# Patient Record
Sex: Female | Born: 1965
Health system: Southern US, Community
[De-identification: ages and names within clinical notes are randomized; demographics above are authoritative.]

## PROBLEM LIST (undated history)

## (undated) DIAGNOSIS — Z9109 Other allergy status, other than to drugs and biological substances: Secondary | ICD-10-CM

## (undated) DIAGNOSIS — D259 Leiomyoma of uterus, unspecified: Secondary | ICD-10-CM

## (undated) DIAGNOSIS — F329 Major depressive disorder, single episode, unspecified: Secondary | ICD-10-CM

## (undated) DIAGNOSIS — F32A Depression, unspecified: Secondary | ICD-10-CM

## (undated) DIAGNOSIS — M1990 Unspecified osteoarthritis, unspecified site: Secondary | ICD-10-CM

## (undated) DIAGNOSIS — D571 Sickle-cell disease without crisis: Secondary | ICD-10-CM

## (undated) DIAGNOSIS — I10 Essential (primary) hypertension: Secondary | ICD-10-CM

## (undated) DIAGNOSIS — D573 Sickle-cell trait: Secondary | ICD-10-CM

## (undated) DIAGNOSIS — T7840XA Allergy, unspecified, initial encounter: Secondary | ICD-10-CM

## (undated) DIAGNOSIS — K219 Gastro-esophageal reflux disease without esophagitis: Secondary | ICD-10-CM

## (undated) DIAGNOSIS — D649 Anemia, unspecified: Secondary | ICD-10-CM

## (undated) DIAGNOSIS — R109 Unspecified abdominal pain: Secondary | ICD-10-CM

## (undated) HISTORY — PX: COLONOSCOPY: SHX174

## (undated) HISTORY — DX: Other allergy status, other than to drugs and biological substances: Z91.09

## (undated) HISTORY — DX: Unspecified osteoarthritis, unspecified site: M19.90

## (undated) HISTORY — PX: TONSILLECTOMY: SHX5217

## (undated) HISTORY — DX: Allergy, unspecified, initial encounter: T78.40XA

## (undated) HISTORY — DX: Sickle-cell trait: D57.3

## (undated) HISTORY — DX: Gastro-esophageal reflux disease without esophagitis: K21.9

## (undated) HISTORY — DX: Major depressive disorder, single episode, unspecified: F32.9

## (undated) HISTORY — DX: Leiomyoma of uterus, unspecified: D25.9

## (undated) HISTORY — PX: TUBAL LIGATION: SHX77

## (undated) HISTORY — DX: Depression, unspecified: F32.A

## (undated) HISTORY — DX: Sickle-cell disease without crisis: D57.1

## (undated) HISTORY — DX: Essential (primary) hypertension: I10

## (undated) HISTORY — DX: Unspecified abdominal pain: R10.9

---

## 1997-08-12 ENCOUNTER — Other Ambulatory Visit: Admission: RE | Admit: 1997-08-12 | Discharge: 1997-08-12 | Payer: Self-pay | Admitting: *Deleted

## 1998-08-26 ENCOUNTER — Other Ambulatory Visit: Admission: RE | Admit: 1998-08-26 | Discharge: 1998-08-26 | Payer: Self-pay | Admitting: *Deleted

## 1999-12-08 ENCOUNTER — Other Ambulatory Visit: Admission: RE | Admit: 1999-12-08 | Discharge: 1999-12-08 | Payer: Self-pay | Admitting: *Deleted

## 2000-12-21 ENCOUNTER — Other Ambulatory Visit: Admission: RE | Admit: 2000-12-21 | Discharge: 2000-12-21 | Payer: Self-pay | Admitting: *Deleted

## 2002-10-09 ENCOUNTER — Other Ambulatory Visit: Admission: RE | Admit: 2002-10-09 | Discharge: 2002-10-09 | Payer: Self-pay | Admitting: Obstetrics and Gynecology

## 2003-04-10 LAB — HM COLONOSCOPY: HM Colonoscopy: NORMAL

## 2006-09-12 ENCOUNTER — Ambulatory Visit: Payer: Self-pay | Admitting: Internal Medicine

## 2007-06-22 ENCOUNTER — Encounter: Payer: Self-pay | Admitting: *Deleted

## 2007-06-22 DIAGNOSIS — Z9089 Acquired absence of other organs: Secondary | ICD-10-CM | POA: Insufficient documentation

## 2007-06-22 DIAGNOSIS — Z9109 Other allergy status, other than to drugs and biological substances: Secondary | ICD-10-CM

## 2007-06-22 DIAGNOSIS — J020 Streptococcal pharyngitis: Secondary | ICD-10-CM | POA: Insufficient documentation

## 2007-06-22 DIAGNOSIS — I10 Essential (primary) hypertension: Secondary | ICD-10-CM | POA: Insufficient documentation

## 2007-06-22 HISTORY — DX: Other allergy status, other than to drugs and biological substances: Z91.09

## 2007-06-22 HISTORY — DX: Essential (primary) hypertension: I10

## 2007-08-21 ENCOUNTER — Inpatient Hospital Stay (HOSPITAL_COMMUNITY): Admission: AD | Admit: 2007-08-21 | Discharge: 2007-08-21 | Payer: Self-pay | Admitting: Obstetrics and Gynecology

## 2007-08-30 ENCOUNTER — Ambulatory Visit: Payer: Self-pay | Admitting: Obstetrics & Gynecology

## 2007-09-07 ENCOUNTER — Ambulatory Visit: Payer: Self-pay | Admitting: Family Medicine

## 2007-09-12 ENCOUNTER — Ambulatory Visit (HOSPITAL_COMMUNITY): Admission: RE | Admit: 2007-09-12 | Discharge: 2007-09-12 | Payer: Self-pay | Admitting: Family Medicine

## 2007-09-14 ENCOUNTER — Ambulatory Visit (HOSPITAL_COMMUNITY): Admission: RE | Admit: 2007-09-14 | Discharge: 2007-09-14 | Payer: Self-pay

## 2007-09-18 ENCOUNTER — Inpatient Hospital Stay (HOSPITAL_COMMUNITY): Admission: AD | Admit: 2007-09-18 | Discharge: 2007-09-18 | Payer: Self-pay | Admitting: Obstetrics & Gynecology

## 2007-09-21 ENCOUNTER — Ambulatory Visit: Payer: Self-pay | Admitting: Obstetrics & Gynecology

## 2007-09-25 ENCOUNTER — Ambulatory Visit (HOSPITAL_COMMUNITY): Admission: RE | Admit: 2007-09-25 | Discharge: 2007-09-25 | Payer: Self-pay | Admitting: Family Medicine

## 2007-09-25 ENCOUNTER — Ambulatory Visit: Payer: Self-pay | Admitting: Family Medicine

## 2007-10-05 ENCOUNTER — Ambulatory Visit: Payer: Self-pay | Admitting: Obstetrics & Gynecology

## 2007-10-10 ENCOUNTER — Ambulatory Visit (HOSPITAL_COMMUNITY): Admission: RE | Admit: 2007-10-10 | Discharge: 2007-10-10 | Payer: Self-pay | Admitting: Family Medicine

## 2007-10-19 ENCOUNTER — Ambulatory Visit: Payer: Self-pay | Admitting: Family Medicine

## 2007-10-24 ENCOUNTER — Ambulatory Visit (HOSPITAL_COMMUNITY): Admission: RE | Admit: 2007-10-24 | Discharge: 2007-10-24 | Payer: Self-pay | Admitting: Gynecology

## 2007-11-02 ENCOUNTER — Ambulatory Visit: Payer: Self-pay | Admitting: Obstetrics & Gynecology

## 2007-11-09 ENCOUNTER — Ambulatory Visit (HOSPITAL_COMMUNITY): Admission: RE | Admit: 2007-11-09 | Discharge: 2007-11-09 | Payer: Self-pay | Admitting: Gynecology

## 2007-11-16 ENCOUNTER — Ambulatory Visit: Payer: Self-pay | Admitting: Family Medicine

## 2007-11-30 ENCOUNTER — Ambulatory Visit (HOSPITAL_COMMUNITY): Admission: RE | Admit: 2007-11-30 | Discharge: 2007-11-30 | Payer: Self-pay | Admitting: Gynecology

## 2007-11-30 ENCOUNTER — Ambulatory Visit: Payer: Self-pay | Admitting: Family Medicine

## 2007-12-14 ENCOUNTER — Ambulatory Visit: Payer: Self-pay | Admitting: Obstetrics & Gynecology

## 2007-12-21 ENCOUNTER — Ambulatory Visit (HOSPITAL_COMMUNITY): Admission: RE | Admit: 2007-12-21 | Discharge: 2007-12-21 | Payer: Self-pay | Admitting: Gynecology

## 2007-12-28 ENCOUNTER — Ambulatory Visit: Payer: Self-pay | Admitting: Family Medicine

## 2007-12-28 ENCOUNTER — Ambulatory Visit (HOSPITAL_COMMUNITY): Admission: RE | Admit: 2007-12-28 | Discharge: 2007-12-28 | Payer: Self-pay | Admitting: Gynecology

## 2008-01-02 ENCOUNTER — Encounter: Payer: Self-pay | Admitting: *Deleted

## 2008-01-02 ENCOUNTER — Inpatient Hospital Stay (HOSPITAL_COMMUNITY): Admission: AD | Admit: 2008-01-02 | Discharge: 2008-01-04 | Payer: Self-pay | Admitting: Obstetrics & Gynecology

## 2008-01-02 ENCOUNTER — Ambulatory Visit: Payer: Self-pay | Admitting: Family Medicine

## 2008-01-05 ENCOUNTER — Inpatient Hospital Stay (HOSPITAL_COMMUNITY): Admission: AD | Admit: 2008-01-05 | Discharge: 2008-01-05 | Payer: Self-pay | Admitting: Obstetrics & Gynecology

## 2008-01-05 ENCOUNTER — Ambulatory Visit: Payer: Self-pay | Admitting: Obstetrics & Gynecology

## 2008-01-05 ENCOUNTER — Ambulatory Visit: Payer: Self-pay | Admitting: Obstetrics and Gynecology

## 2008-01-08 ENCOUNTER — Ambulatory Visit: Payer: Self-pay | Admitting: Obstetrics & Gynecology

## 2008-01-08 ENCOUNTER — Ambulatory Visit: Payer: Self-pay | Admitting: Family Medicine

## 2008-01-08 ENCOUNTER — Inpatient Hospital Stay (HOSPITAL_COMMUNITY): Admission: AD | Admit: 2008-01-08 | Discharge: 2008-01-09 | Payer: Self-pay | Admitting: Gynecology

## 2008-01-08 ENCOUNTER — Encounter: Payer: Self-pay | Admitting: *Deleted

## 2008-01-16 ENCOUNTER — Ambulatory Visit: Payer: Self-pay | Admitting: Family Medicine

## 2008-01-23 ENCOUNTER — Ambulatory Visit: Payer: Self-pay | Admitting: Internal Medicine

## 2008-02-01 ENCOUNTER — Ambulatory Visit: Payer: Self-pay | Admitting: Obstetrics and Gynecology

## 2008-02-01 ENCOUNTER — Encounter: Payer: Self-pay | Admitting: Obstetrics and Gynecology

## 2008-02-27 ENCOUNTER — Ambulatory Visit (HOSPITAL_COMMUNITY): Admission: RE | Admit: 2008-02-27 | Discharge: 2008-02-27 | Payer: Self-pay | Admitting: Obstetrics and Gynecology

## 2008-03-11 ENCOUNTER — Ambulatory Visit (HOSPITAL_COMMUNITY): Admission: RE | Admit: 2008-03-11 | Discharge: 2008-03-11 | Payer: Self-pay | Admitting: Obstetrics & Gynecology

## 2008-03-11 ENCOUNTER — Ambulatory Visit: Payer: Self-pay | Admitting: Obstetrics & Gynecology

## 2008-03-20 ENCOUNTER — Ambulatory Visit: Payer: Self-pay | Admitting: Obstetrics & Gynecology

## 2008-04-29 ENCOUNTER — Ambulatory Visit (HOSPITAL_COMMUNITY): Admission: RE | Admit: 2008-04-29 | Discharge: 2008-04-29 | Payer: Self-pay | Admitting: Obstetrics and Gynecology

## 2008-05-08 ENCOUNTER — Ambulatory Visit: Payer: Self-pay | Admitting: Obstetrics & Gynecology

## 2008-07-22 ENCOUNTER — Telehealth: Payer: Self-pay | Admitting: Internal Medicine

## 2008-07-23 ENCOUNTER — Encounter: Payer: Self-pay | Admitting: Internal Medicine

## 2008-07-29 ENCOUNTER — Encounter: Payer: Self-pay | Admitting: Internal Medicine

## 2009-03-25 ENCOUNTER — Ambulatory Visit (HOSPITAL_COMMUNITY): Admission: RE | Admit: 2009-03-25 | Discharge: 2009-03-25 | Payer: Self-pay | Admitting: Family Medicine

## 2009-04-02 ENCOUNTER — Encounter: Admission: RE | Admit: 2009-04-02 | Discharge: 2009-04-02 | Payer: Self-pay | Admitting: Family Medicine

## 2009-04-02 LAB — HM MAMMOGRAPHY

## 2009-04-15 ENCOUNTER — Telehealth: Payer: Self-pay | Admitting: Internal Medicine

## 2009-04-15 ENCOUNTER — Ambulatory Visit: Payer: Self-pay | Admitting: Internal Medicine

## 2009-04-15 DIAGNOSIS — R109 Unspecified abdominal pain: Secondary | ICD-10-CM | POA: Insufficient documentation

## 2009-04-15 HISTORY — DX: Unspecified abdominal pain: R10.9

## 2009-04-15 LAB — CONVERTED CEMR LAB
ALT: 14 units/L (ref 0–35)
AST: 17 units/L (ref 0–37)
Albumin: 3.7 g/dL (ref 3.5–5.2)
Alkaline Phosphatase: 47 units/L (ref 39–117)
BUN: 10 mg/dL (ref 6–23)
Basophils Absolute: 0 10*3/uL (ref 0.0–0.1)
Basophils Relative: 0.8 % (ref 0.0–3.0)
Bilirubin Urine: NEGATIVE
Bilirubin, Direct: 0.2 mg/dL (ref 0.0–0.3)
Blood in Urine, dipstick: NEGATIVE
CO2: 30 meq/L (ref 19–32)
Calcium: 9.2 mg/dL (ref 8.4–10.5)
Chloride: 104 meq/L (ref 96–112)
Creatinine, Ser: 0.9 mg/dL (ref 0.4–1.2)
Eosinophils Absolute: 0.1 10*3/uL (ref 0.0–0.7)
Eosinophils Relative: 1.9 % (ref 0.0–5.0)
GFR calc non Af Amer: 86.9 mL/min (ref >60)
Glucose, Bld: 96 mg/dL (ref 70–99)
Glucose, Urine, Semiquant: NEGATIVE
HCT: 38.3 % (ref 36.0–46.0)
Hemoglobin: 12.8 g/dL (ref 12.0–15.0)
Ketones, urine, test strip: NEGATIVE
Lymphocytes Relative: 36.8 % (ref 12.0–46.0)
Lymphs Abs: 2.3 10*3/uL (ref 0.7–4.0)
MCHC: 33.3 g/dL (ref 30.0–36.0)
MCV: 87.5 fL (ref 78.0–100.0)
Monocytes Absolute: 0.7 10*3/uL (ref 0.1–1.0)
Monocytes Relative: 10.8 % (ref 3.0–12.0)
Neutro Abs: 3.1 10*3/uL (ref 1.4–7.7)
Neutrophils Relative %: 49.7 % (ref 43.0–77.0)
Nitrite: NEGATIVE
Platelets: 173 10*3/uL (ref 150.0–400.0)
Potassium: 3.7 meq/L (ref 3.5–5.1)
Protein, U semiquant: 30
RBC: 4.38 M/uL (ref 3.87–5.11)
RDW: 14.3 % (ref 11.5–14.6)
Sodium: 141 meq/L (ref 135–145)
Specific Gravity, Urine: 1.025
Total Bilirubin: 1.1 mg/dL (ref 0.3–1.2)
Total Protein: 7 g/dL (ref 6.0–8.3)
Urobilinogen, UA: 0.2
WBC Urine, dipstick: NEGATIVE
WBC: 6.2 10*3/uL (ref 4.5–10.5)
pH: 5

## 2009-04-16 ENCOUNTER — Telehealth: Payer: Self-pay | Admitting: Internal Medicine

## 2009-04-16 ENCOUNTER — Ambulatory Visit: Payer: Self-pay | Admitting: Internal Medicine

## 2009-05-30 ENCOUNTER — Ambulatory Visit: Payer: Self-pay | Admitting: Obstetrics & Gynecology

## 2009-05-30 LAB — CONVERTED CEMR LAB: Pap Smear: NEGATIVE

## 2009-05-30 LAB — HM PAP SMEAR: HM Pap smear: NORMAL

## 2010-05-28 NOTE — Progress Notes (Signed)
  Phone Note Call from Patient   Caller: Patient Summary of Call: Dr. Debby Bud received email from pt requesting to restart HCTZ. Dr. Debby Bud approved same. Initial call taken by: Zackery Barefoot CMA,  July 22, 2008 12:40 PM    New/Updated Medications: HYDROCHLOROTHIAZIDE 12.5 MG TABS (HYDROCHLOROTHIAZIDE) Take 1 tablet by mouth once a day   Prescriptions: HYDROCHLOROTHIAZIDE 12.5 MG TABS (HYDROCHLOROTHIAZIDE) Take 1 tablet by mouth once a day  #30 x 5   Entered by:   Zackery Barefoot CMA   Authorized by:   Jacques Navy MD   Signed by:   Zackery Barefoot CMA on 07/22/2008   Method used:   Electronically to        [facility] [address] (retail)       [id number]       [place], [place]  [address]       Ph: [phone]       Fax: [phone]   RxID:   [id number]

## 2010-05-28 NOTE — Medication Information (Signed)
Summary: Request for HCTZ/Patient  Request for HCTZ/Patient   Imported By: Sherian Rein 07/23/2008 09:52:26  _____________________________________________________________________  External Attachment:    Type:   Image     Comment:   External Document
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
## 2010-05-28 NOTE — Letter (Signed)
Summary: Rx HCTZ request from pt/[facility]  Rx HCTZ request from pt/[facility]   Imported By: Lester [place] 08/07/2008 10:42:11  _____________________________________________________________________  External Attachment:    Type:   Image     Comment:   External Document

## 2010-05-28 NOTE — Progress Notes (Signed)
  Phone Note Call from Patient Call back at [facility] Phone [phone]   Summary of Call: Patient is requesting results of labs.  Initial call taken by: Lamar Sprinkles, CMA,  April 16, 2009 3:25 PM  Follow-up for Phone Call        normal Follow-up by: Etta Grandchild MD,  April 16, 2009 3:29 PM  Additional Follow-up for Phone Call Additional follow up Details #1::        Patient notified  labs/CT results normal per MD. [facility] wanted to ask MD what can she take for pain. Please advise Additional Follow-up by: Rock Nephew CMA,  April 16, 2009 4:10 PM    Additional Follow-up for Phone Call Additional follow up Details #2::    tramadol Follow-up by: Etta Grandchild MD,  April 16, 2009 4:12 PM  Additional Follow-up for Phone Call Additional follow up Details #3:: Details for Additional Follow-up Action Taken: pt notified Additional Follow-up by: Rock Nephew CMA,  April 16, 2009 4:17 PM  New/Updated Medications: TRAMADOL HCL 50 MG TABS (TRAMADOL HCL) 1-2 by mouth QID rpn for pain Prescriptions: TRAMADOL HCL 50 MG TABS (TRAMADOL HCL) 1-2 by mouth QID rpn for pain  #50 x 1   Entered and Authorized by:   Etta Grandchild MD   Signed by:   Etta Grandchild MD on 04/16/2009   Method used:   Electronically to        [facility] [address] (retail)       [id number]       [place], [place]  [address]       Ph: [phone]       Fax: [phone]   RxID:   [id number]
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
## 2010-07-29 ENCOUNTER — Other Ambulatory Visit: Payer: Self-pay | Admitting: Obstetrics & Gynecology

## 2010-07-29 DIAGNOSIS — Z1231 Encounter for screening mammogram for malignant neoplasm of breast: Secondary | ICD-10-CM

## 2010-08-10 ENCOUNTER — Ambulatory Visit (HOSPITAL_COMMUNITY)
Admission: RE | Admit: 2010-08-10 | Discharge: 2010-08-10 | Disposition: A | Discharge status: Home / Self Care | Payer: Self-pay | Source: Ambulatory Visit | Attending: Obstetrics & Gynecology | Admitting: Obstetrics & Gynecology

## 2010-08-10 DIAGNOSIS — Z1231 Encounter for screening mammogram for malignant neoplasm of breast: Secondary | ICD-10-CM

## 2010-09-08 NOTE — Discharge Summary (Signed)
NAME:  Aimee Anderson, WINBERRY NO.:  [id number]   MEDICAL RECORD NO.:  [id number]          PATIENT TYPE:  INP   LOCATION:  9157                          FACILITY:  [facility]   PHYSICIAN:  Lesly Dukes, M.D. DATE OF BIRTH:  [birth year]   DATE OF ADMISSION:  01/02/2008  DATE OF DISCHARGE:  01/04/2008                               DISCHARGE SUMMARY   REASON FOR HOSPITALIZATION:  Ms. Aimee Anderson is a 45 year old gravida  5, para 2-1-1-2 admitted at 26 weeks' gestational age for monitoring  after an amniocentesis for fetal chromosome check.  An ultrasound done  at that time revealed intrauterine growth restriction which was known.  However, the new finding was reverse flow on umbilical artery Dopplers.  Upon admission to the hospital, the patient with known chronic  hypertension was found to have severely elevated blood pressures in the  range of 160s over 100s to 110s.  Other important factors for history  her integrated screen revealed 1 in 5 risk for trisomy 22, she has  advanced maternal age, she has an incompetent cervix with cervical  cerclage that was placed at 13 weeks.  She had been taking labetalol 200  mg twice a day for her blood pressure during this pregnancy.   HOSPITAL COURSE:  The patient was admitted and her elevated blood  pressures required extra dosage of oral labetalol as well as IV  hydralazine for better blood pressure control.  Several hours after  admission, the results of the amniocentesis revealed confirm the  presence of trisomy 18.  Because of the fact that this is a non-  survivable congenital anomaly, the patient was significantly counseled  on the baby's survivability and the decision was made to expectantly  managed the patient.  She required an increase in her labetalol to 400  mg b.i.d. and an addition of Norvasc 5 mg daily for better blood  pressure control.  On the day of discharge, her blood pressures are in  the 130s over 70s to 80s range.   In the morning of discharge, she had a  headache, which prompted [place] to evaluate some labs.  Her urinalysis  revealed no protein.  Her CBC was normal.  A CMP had normal liver  functions as well as normal creatinine level.  Additionally, her  headache resolved after she ate breakfast and took a shower.  The  patient will be discharged home with a follow up tomorrow in the [facility] for blood pressure check and then another followup on Monday  January 08, 2008, for reevaluation.  The patient was given  prescriptions for her new blood pressure meds and advised on the new  dosing.  She was advised to restrict her activities to avoid any access  of walking or prolonged standing on her feet.  She was also advised to  return to the clinic or to return admission unit for any evidence of  contractions, loss of fluid, or fevers or chills.   PERTINENT LABORATORY DATA:  As described above.  A 24-hour urine  revealed a protein level of 90 mg.  FINAL DIAGNOSES:  1. Pregnancy-induced hypertension.  2. Fetal trisomy 18.  3. Incompetent cervix with cervical cerclage in place.   SIGNIFICANT FINDINGS:  Trisomy 18 per amniocentesis as described above.   PROCEDURE PERFORMED:  Amniocentesis.   CONDITION ON DISCHARGE:  Stable.   Instructions given to the patient as per hospital course.      Odie Sera, DO  Electronically Signed     ______________________________  Lesly Dukes, M.D.    MC/MEDQ  D:  01/04/2008  T:  01/05/2008  Job:  [id number]

## 2010-09-08 NOTE — Group Therapy Note (Signed)
NAME:  JAMESON, MORROW NO.:  [id number]   MEDICAL RECORD NO.:  [id number]         PATIENT TYPE:  AWOC   LOCATION:  [facility]                     FACILITY:   PHYSICIAN:  Johnella Moloney, MD        DATE OF BIRTH:  1966/03/03   DATE OF SERVICE:                                  CLINIC NOTE   REASON FOR VISIT:  Ms. Kyera Felan is a 45 year old gravida 5, para 2-  2-1-2, who is 4 weeks status post a spontaneous vaginal delivery after  an intrauterine fetal demise secondary to trisomy 25.  She presents  today for her routine postpartum followup.  She relates that her  bleeding has essentially ceased as of about 1 week ago.  She has minimal-  to-no abdominal pain.  She has been having no headaches or visual  problems.  Overall, she is doing quite well.  Emotionally, she is  hanging in there; however, she still has periods of sadness and crying.  She had recently decided that she is going to contact [employer], a  local organization, that deals with these kind of things.  Additionally,  it is important to note that she has been followed by a primary care  provider for her blood pressure regulation.   PHYSICAL EXAMINATION:  GENERAL:  Her blood pressure today is 131/91, her  heart rate is 74, respirations 16, temperature 97.1.  Her weight today  is 199.4 pounds.  LUNGS:  Clear to auscultation.  CARDIOVASCULAR:  Regular without murmur.  ABDOMEN:  Soft, obese, nontender.  Her uterus is barely palpable right  above her pubic symphysis.  BREASTS:  She has pendulous breasts with no discrete mass or nodule.  Fibrocystic breast tissue is noted.  She has no axillary  lymphadenopathy.  GU:  The external female genitalia is normal.  The vaginal mucosa is  normal.  Cervix is a parous cervix without lesion appreciated.  There is  a minimal amount of cervical mucus discharge.  Pap smear was done.  The  bimanual exam reveals a slightly enlarged uterus that is consistent with  4 weeks  postpartum state.  There is no adnexal mass or tenderness noted.  Her perineum appears normal.  I do not appreciate a cystocele or a  rectocele.   ASSESSMENT AND PLAN:  A routine postpartum exam.  No identifiable  abnormalities at this time.  A Pap smear is collected.  GC and Chlamydia  were also sent due to the patient's request for an IUD.  Because of her  lack of insurance, she completed the paperwork to get on the list for  funded mammogram as well as an IUD placement.  We had a lengthy  discussion on her contraceptive options and given her age as well as her  difficult to control blood pressure, I recommended to her that she  utilize a progesterone only type birth control and the patient would  prefer to have the IUD.  I discussed with her that if she cannot get the  IUD in time that she should probably come back for a Depo shot, so she  has some  type of contraception.  The patient will be notified by mail or  phone call of her Pap  results and she will return to the clinic when she gets notification of  her IUD finding.  The patient's questions were answered, and the patient  voiced understanding.     ______________________________  Odie Sera, D.O.    ______________________________  Johnella Moloney, MD    MC/MEDQ  D:  02/01/2008  T:  02/02/2008  Job:  [id number]
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
## 2010-09-08 NOTE — Op Note (Signed)
NAME:  Aimee Anderson, Aimee Anderson NO.:  [id number]   MEDICAL RECORD NO.:  [id number]          PATIENT TYPE:  AMB   LOCATION:                                [facility]   PHYSICIAN:  Tanya S. Shawnie Pons, M.D.   DATE OF BIRTH:  24-Jun-1965   DATE OF PROCEDURE:  09/25/2007  DATE OF DISCHARGE:                               OPERATIVE REPORT   PREOPERATIVE DIAGNOSIS:  Incompetent cervix.   POSTOPERATIVE DIAGNOSIS:  Incompetent cervix.   PROCEDURE:  Cervical cerclage, McDonald tie and Mersilene band knot at  12 o' clock.   SURGEON:  Shelbie Proctor. Shawnie Pons, MD   ASSISTANT:  None.   ANESTHESIA:  Spinal.   FINDINGS:  Cervix 1 cm at the external, very long cervix and parous.   SPECIMENS:  None.   ESTIMATED BLOOD LOSS:  Minimal.   COMPLICATIONS:  None immediately known.   HISTORY OF PRESENT ILLNESS:  The patient is a 45 year old who has a  history of incompetent cervix and has required two cervical cerclages in  the past.  She also has a history of previous C-section x1.  The patient  also has hypertension all about for this pregnancy. Of note, she has  undergone ovary screening which has revealed an increased risk of  trisomy 73, as well as increased risk of trisomy 86.  The patient has  been counseled regarding her options for this and she has declined  amniocentesis or further workup at this time. She does want a cerclage  even if these are possibilities.  The patient was counseled regarding  this on Thursday at [facility] and she reported god is going to  take care of this pregnancy.   PROCEDURE:  The patient was taken to the OR where she was placed in  dorsal lithotomy in Bolckow stirrups.  She was prepped and draped in the  usual sterile fashion.  A red rubber catheter was used to drain her  bladder.  A weighted speculum was placed inside the vagina and a  retractor was placed anteriorly and the cervix was visualized easily.  It was grasped with ring forceps and a  Mersilene band was used to place  a cerclage.  Cerclage was started at 12 o'clock to 9 o'clock from 9 to  6, from 6 to 3 and from 3 to 12.  The suture was cinched down until it  permitted  just a fingertip through the cervix and the knot tied a 12 o'clock.  All  edges were then removed from the vagina.  All instrument and lap counts  were correct x2.  The patient was awake and taken to the recovery room  in stable condition.      Shelbie Proctor. Shawnie Pons, M.D.  Electronically Signed     TSP/MEDQ  D:  09/25/2007  T:  09/25/2007  Job:  [id number]

## 2010-09-08 NOTE — Discharge Summary (Signed)
NAME:  Aimee Anderson, Aimee Anderson NO.:  [id number]   MEDICAL RECORD NO.:  [id number]          PATIENT TYPE:  INP   LOCATION:  9316                          [facility]ILITY:  [facility]   PHYSICIAN:  Lesly Dukes, M.D. DATE OF BIRTH:  [birth year]   DATE OF ADMISSION:  01/08/2008  DATE OF DISCHARGE:  01/09/2008                               DISCHARGE SUMMARY   REASON FOR HOSPITALIZATION:  [name] is a 45 year old gravida 5, para  2-1-1-2 at 39 and 5/7th weeks gestational age who was admitted for an  induction of labor after intrauterine fetal demise of fetus with known  trisomy 67.  On previous hospitalization approximately 1 week ago, the  patient was diagnosed with a fetus of trisomy 3 by amniocentesis.  The  patient was discharged from the hospital at that time per the patient's  request and returned on the date of admission for antenatal testing.  During that evaluation, the baby was noted to have no cardiac activity.  Therefore, the patient was admitted for induction of labor.   HOSPITAL COURSE:  The patient was admitted and her cerclage was removed.  A Foley bulb was placed for ripening of the cervix.  The patient was  started on Pitocin several hours later.  The patient progressed in the  usual manner and at 2:35 in a.m. on January 09, 2008, she had a  spontaneous vaginal delivery of a nonviable female in vertex  presentation.  She was given Cytotec 800 mcg per rectum to facilitate  delivery of the placenta.  Placenta delivered spontaneously  approximately 2 hours later.  Estimated blood loss for the delivery was  150 mL.  After the procedure, the patient did well.  Her blood pressure  was well-controlled with blood pressures in 120s-130s over 80s.  At the  time of admission, she has minimal lochia, pain is well tolerated, she  is ambulating, tolerating p.o.  She relates that she is emotionally  hanging in there.   FINAL DIAGNOSES:  1. Intrauterine fetal demise secondary  to trisomy 80.  2. Status post normal spontaneous vaginal delivery.  3. Chronic hypertension.   PROCEDURE PERFORMED:  Spontaneous vaginal delivery.   CONDITION OF PATIENT AT DISCHARGE.:  Good.   INSTRUCTIONS FOR THE PATIENT:  The patient was given no restrictions on  diet or activity.  The patient was instructed to take labetalol 400 mg 3  times daily and Norvasc 5 mg daily.  The patient had ampules of Norvasc,  was given a prescription for the labetalol.  Baby lab nurse will visit  her on Friday, January 12, 2008, for blood  pressure check.  She had been instructed to return to the office to  complete paperwork for tubal ligation per the patient's request.  The  patient was also instructed to follow up with her primary care doctor,  Dr. Debby Bud of the [facility] in 1-2 weeks for further evaluation of  her blood pressures.      Odie Sera, DO  Electronically Signed     ______________________________  Lesly Dukes, M.D.    MC/MEDQ  D:  01/09/2008  T:  01/10/2008  Job:  [id number]   cc:   Rosalyn Gess. Norins, MD  [address]  [place]  [place] [address]

## 2010-09-08 NOTE — Group Therapy Note (Signed)
NAME:  Aimee Anderson, Aimee Anderson NO.:  [id number]   MEDICAL RECORD NO.:  [id number]          PATIENT TYPE:  WOC   LOCATION:  [facility]                   FACILITY:  [facility]   PHYSICIAN:  Elsie Lincoln, MD      DATE OF BIRTH:  10-20-1965   DATE OF SERVICE:  03/20/2008                                  CLINIC NOTE   The patient is a 45 year old woman who underwent laparoscopic bilateral  tubal ligation.  We did find a small cyst over ovary that seemed very  rubbery and unable to be biopsied.  I told the patient at the time of  laparoscopy which she does not remember.  I did tell her husband and he  did not inform her.  I told her that we need to follow this with  ultrasound given that I was unable to biopsy it.  If it does not go away  in 2-3 cycles, then we can consider doing laparoscopic oophorectomy.  From her procedure, she is doing very well.  Her umbilicus is well  healed.  Her blood pressure is well controlled on Norvasc and Labetalol.  The patient is to come back early January after her ultrasound.           ______________________________  Elsie Lincoln, MD     KL/MEDQ  D:  03/20/2008  T:  03/20/2008  Job:  [id number]

## 2010-09-08 NOTE — Op Note (Signed)
NAME:  Aimee Anderson, Aimee Anderson NO.:  [id number]   MEDICAL RECORD NO.:  [id number]         PATIENT TYPE:  WAMB   LOCATION:  WOC                           [facility]   PHYSICIAN:  Lesly Dukes, M.D. DATE OF BIRTH:  01-Mar-1966   DATE OF PROCEDURE:  03/11/2008  DATE OF DISCHARGE:                               OPERATIVE REPORT   PREOPERATIVE DIAGNOSIS:  A 45 year old female with undesired fertility.   POSTOPERATIVE DIAGNOSES:  36. A 45 year old female with undesired fertility.  2. A 1 x 2 cm right ovarian cyst.   PROCEDURE:  Laparoscopic bilateral tubal ligation with Filshie clips.   SURGEON:  Lesly Dukes, MD   ANESTHESIA:  General.   FINDINGS:  Grossly normal uterus and fallopian tubes.  Left ovary  normal; right ovary has 1 x 2 cm calcified nodule that is unable to be  biopsied with the forceps, does not appear malignant.   ESTIMATED BLOOD LOSS:  Minimal.   COMPLICATIONS:  None.   DESCRIPTION OF PROCEDURE:  After informed consent was obtained, the  patient was taken to the operating room where general anesthesia was  induced.  The patient was placed in dorsal lithotomy position and was  prepared and draped in normal sterile fashion.  The bladder was emptied.  A Hulka clip was placed on the cervix.  Gloves were changed.  An  umbilical port was placed via the open technique.  A Hasson trocar was  anchored to the fascia with 0 Vicryl.  Pneumoperitoneum was achieved  with CO2, and the operative laparoscope was introduced.  The above  findings were noted.  A left lower quadrant port was placed in direct  visualization to aid in tubal ligation.  Each fallopian tube was  followed out to its fimbriated end and a Filshie clip was placed across  each fallopian tube without difficulty.  The right ovarian cyst was  approximately 1 x 2 cm, very hard and unable to get a biopsy.  There did  not appear to be an apparent easy plane in order to do a cystectomy.  The  patient was not consented for an oophorectomy at this time, so it  was decided to follow the patient with ultrasound in 6-8 weeks and  return if needed to the operating room for a right oophorectomy.  Also,  of note, there was most likely a small 1-cm umbilical hernia.  All  instruments were removed from the patient's abdomen, and the  pneumoperitoneum was released.  The fascia was identified inferiorly  without problems.  Superiorly, there appeared to be a small hernia.  The  fascia was grasped and reapproximated with 0 Vicryl.  Several sutures of  0 Vicryl were used to reapproximate the fascia.  The subcutaneous tissue  at the umbilical skin incision was closed with 4-0 Vicryl in  subcuticular fashion.  The left lower quadrant port  was closed with Dermabond; 6 mL of 0.5% Marcaine was used for analgesia.  The Hulka clip was removed from the cervix.  The patient tolerated the  procedure well.  The sponge, lap, instrument, and needle  count were  correct x2, and the patient went to recovery in stable condition.      Lesly Dukes, M.D.  Electronically Signed     KHL/MEDQ  D:  03/11/2008  T:  03/12/2008  Job:  [id number]

## 2010-10-05 ENCOUNTER — Other Ambulatory Visit: Payer: Self-pay | Admitting: Internal Medicine

## 2011-01-19 LAB — URINALYSIS, ROUTINE W REFLEX MICROSCOPIC
Bilirubin Urine: NEGATIVE
Glucose, UA: NEGATIVE
Ketones, ur: NEGATIVE
Leukocytes, UA: NEGATIVE
Nitrite: NEGATIVE
Protein, ur: NEGATIVE
Specific Gravity, Urine: 1.01
Urobilinogen, UA: 0.2
pH: 5

## 2011-01-19 LAB — URINE MICROSCOPIC-ADD ON

## 2011-01-19 LAB — GC/CHLAMYDIA PROBE AMP, GENITAL
Chlamydia, DNA Probe: NEGATIVE
GC Probe Amp, Genital: NEGATIVE

## 2011-01-19 LAB — CBC
HCT: 36
Hemoglobin: 12.5
MCHC: 34.7
MCV: 84.9
Platelets: 202
RBC: 4.23
RDW: 16.7 — ABNORMAL HIGH
WBC: 7.8

## 2011-01-19 LAB — WET PREP, GENITAL
Clue Cells Wet Prep HPF POC: NONE SEEN
Trich, Wet Prep: NONE SEEN
Yeast Wet Prep HPF POC: NONE SEEN

## 2011-01-19 LAB — POCT PREGNANCY, URINE
Operator id: 222261
Preg Test, Ur: POSITIVE

## 2011-01-20 LAB — COMPREHENSIVE METABOLIC PANEL
ALT: 16
AST: 17
Albumin: 3 — ABNORMAL LOW
Alkaline Phosphatase: 32 — ABNORMAL LOW
BUN: 3 — ABNORMAL LOW
CO2: 24
Calcium: 9.3
Chloride: 108
Creatinine, Ser: 0.68
GFR calc Af Amer: >60
GFR calc non Af Amer: >60
Glucose, Bld: 100 — ABNORMAL HIGH
Potassium: 3.5
Sodium: 136
Total Bilirubin: 0.6
Total Protein: 6.3

## 2011-01-20 LAB — POCT URINALYSIS DIP (DEVICE)
Bilirubin Urine: NEGATIVE
Glucose, UA: NEGATIVE
Ketones, ur: NEGATIVE
Nitrite: NEGATIVE
Operator id: 200901
Protein, ur: NEGATIVE
Specific Gravity, Urine: 1.015
Urobilinogen, UA: 0.2
pH: 5

## 2011-01-20 LAB — CBC
HCT: 35 — ABNORMAL LOW
Hemoglobin: 12.2
MCHC: 34.8
MCV: 86.2
Platelets: 169
RBC: 4.06
RDW: 15.3
WBC: 7.6

## 2011-01-20 LAB — URIC ACID: Uric Acid, Serum: 3.8

## 2011-01-21 LAB — POCT URINALYSIS DIP (DEVICE)
Bilirubin Urine: NEGATIVE
Bilirubin Urine: NEGATIVE
Bilirubin Urine: NEGATIVE
Glucose, UA: NEGATIVE
Glucose, UA: NEGATIVE
Glucose, UA: NEGATIVE
Hgb urine dipstick: NEGATIVE
Hgb urine dipstick: NEGATIVE
Ketones, ur: NEGATIVE
Ketones, ur: NEGATIVE
Ketones, ur: NEGATIVE
Nitrite: NEGATIVE
Nitrite: NEGATIVE
Nitrite: NEGATIVE
Operator id: 15968
Operator id: 297281
Operator id: 148111
Protein, ur: 30 — AB
Protein, ur: NEGATIVE
Protein, ur: NEGATIVE
Specific Gravity, Urine: 1.025
Specific Gravity, Urine: 1.025
Specific Gravity, Urine: 1.025
Urobilinogen, UA: 0.2
Urobilinogen, UA: 0.2
Urobilinogen, UA: 0.2
pH: 5.5
pH: 5
pH: 5.5

## 2011-01-22 LAB — POCT URINALYSIS DIP (DEVICE)
Bilirubin Urine: NEGATIVE
Bilirubin Urine: NEGATIVE
Glucose, UA: NEGATIVE
Glucose, UA: NEGATIVE
Hgb urine dipstick: NEGATIVE
Hgb urine dipstick: NEGATIVE
Ketones, ur: NEGATIVE
Ketones, ur: NEGATIVE
Nitrite: NEGATIVE
Nitrite: NEGATIVE
Operator id: 148111
Operator id: 297281
Protein, ur: NEGATIVE
Protein, ur: NEGATIVE
Specific Gravity, Urine: <=1.005
Specific Gravity, Urine: 1.02
Urobilinogen, UA: 0.2
Urobilinogen, UA: 0.2
pH: 5
pH: 5.5

## 2011-01-26 LAB — CBC
HCT: 37.5
Hemoglobin: 12.8
MCHC: 34.3
MCV: 88.3
Platelets: 185
RBC: 4.24
RDW: 13.4
WBC: 5.2

## 2011-01-26 LAB — PREGNANCY, URINE: Preg Test, Ur: NEGATIVE

## 2011-01-27 LAB — URINALYSIS, ROUTINE W REFLEX MICROSCOPIC
Bilirubin Urine: NEGATIVE
Bilirubin Urine: NEGATIVE
Glucose, UA: NEGATIVE
Glucose, UA: NEGATIVE
Hgb urine dipstick: NEGATIVE
Hgb urine dipstick: NEGATIVE
Ketones, ur: NEGATIVE
Ketones, ur: NEGATIVE
Nitrite: NEGATIVE
Nitrite: NEGATIVE
Protein, ur: NEGATIVE
Protein, ur: NEGATIVE
Specific Gravity, Urine: 1.025
Specific Gravity, Urine: <1.005 — ABNORMAL LOW
Urobilinogen, UA: 0.2
Urobilinogen, UA: 0.2
pH: 5.5
pH: 6

## 2011-01-27 LAB — POCT URINALYSIS DIP (DEVICE)
Bilirubin Urine: NEGATIVE
Bilirubin Urine: NEGATIVE
Glucose, UA: NEGATIVE
Glucose, UA: NEGATIVE
Hgb urine dipstick: NEGATIVE
Hgb urine dipstick: NEGATIVE
Ketones, ur: NEGATIVE
Ketones, ur: NEGATIVE
Nitrite: NEGATIVE
Nitrite: NEGATIVE
Operator id: 297281
Operator id: 297281
Protein, ur: NEGATIVE
Protein, ur: NEGATIVE
Specific Gravity, Urine: 1.01
Specific Gravity, Urine: 1.02
Urobilinogen, UA: 0.2
Urobilinogen, UA: 0.2
pH: 5
pH: 5

## 2011-01-27 LAB — CBC
HCT: 34.9 — ABNORMAL LOW
HCT: 36.2
HCT: 36.2
HCT: 36.4
HCT: 37.6
HCT: 37.8
Hemoglobin: 11.9 — ABNORMAL LOW
Hemoglobin: 12.2
Hemoglobin: 12.3
Hemoglobin: 12.4
Hemoglobin: 12.8
Hemoglobin: 12.8
MCHC: 33.5
MCHC: 33.8
MCHC: 33.9
MCHC: 34.1
MCHC: 34.1
MCHC: 34.1
MCV: 89.9
MCV: 90.1
MCV: 90.4
MCV: 90.7
MCV: 90.7
MCV: 90.8
Platelets: 148 — ABNORMAL LOW
Platelets: 152
Platelets: 155
Platelets: 159
Platelets: 160
Platelets: 162
RBC: 3.88
RBC: 4.01
RBC: 4.01
RBC: 4.03
RBC: 4.15
RBC: 4.17
RDW: 13.6
RDW: 13.6
RDW: 13.8
RDW: 13.9
RDW: 13.9
RDW: 14
WBC: 6.5
WBC: 7.1
WBC: 7.7
WBC: 7.9
WBC: 8
WBC: 8.2

## 2011-01-27 LAB — COMPREHENSIVE METABOLIC PANEL
ALT: 19
ALT: 21
ALT: 27
AST: 22
AST: 27
AST: 27
Albumin: 2.7 — ABNORMAL LOW
Albumin: 2.9 — ABNORMAL LOW
Albumin: 3 — ABNORMAL LOW
Alkaline Phosphatase: 41
Alkaline Phosphatase: 42
Alkaline Phosphatase: 44
BUN: 6
BUN: 7
BUN: 8
CO2: 20
CO2: 20
CO2: 21
Calcium: 8.8
Calcium: 9.5
Calcium: 9.7
Chloride: 103
Chloride: 105
Chloride: 106
Creatinine, Ser: 0.64
Creatinine, Ser: 0.66
Creatinine, Ser: 0.66
GFR calc Af Amer: >60
GFR calc Af Amer: >60
GFR calc Af Amer: >60
GFR calc non Af Amer: >60
GFR calc non Af Amer: >60
GFR calc non Af Amer: >60
Glucose, Bld: 113 — ABNORMAL HIGH
Glucose, Bld: 76
Glucose, Bld: 84
Potassium: 3.2 — ABNORMAL LOW
Potassium: 4.2
Potassium: 4.2
Sodium: 130 — ABNORMAL LOW
Sodium: 132 — ABNORMAL LOW
Sodium: 134 — ABNORMAL LOW
Total Bilirubin: 0.8
Total Bilirubin: 0.8
Total Bilirubin: 0.9
Total Protein: 5.6 — ABNORMAL LOW
Total Protein: 5.7 — ABNORMAL LOW
Total Protein: 6.1

## 2011-01-27 LAB — PROTEIN, URINE, 24 HOUR
Collection Interval-UPROT: 24
Protein, 24H Urine: 90
Protein, Urine: 5
Urine Total Volume-UPROT: 1800

## 2011-01-27 LAB — HEPATIC FUNCTION PANEL
ALT: 20
AST: 21
Albumin: 2.5 — ABNORMAL LOW
Alkaline Phosphatase: 37 — ABNORMAL LOW
Bilirubin, Direct: 0.1
Indirect Bilirubin: 0.7
Total Bilirubin: 0.8
Total Protein: 5.3 — ABNORMAL LOW

## 2011-01-27 LAB — CREATININE CLEARANCE, URINE, 24 HOUR
Collection Interval-CRCL: 24
Creatinine Clearance: 186 — ABNORMAL HIGH
Creatinine, 24H Ur: 1764
Creatinine, Urine: 98
Creatinine: 0.66
Urine Total Volume-CRCL: 1800

## 2011-01-27 LAB — RPR
RPR Ser Ql: NONREACTIVE
RPR Ser Ql: NONREACTIVE

## 2011-01-27 LAB — LACTATE DEHYDROGENASE: LDH: 151

## 2011-01-27 LAB — URIC ACID
Uric Acid, Serum: 5
Uric Acid, Serum: 5.1

## 2011-01-27 LAB — TISSUE HYBRIDIZATION TO NCBH

## 2011-03-04 ENCOUNTER — Encounter: Payer: Self-pay | Admitting: Internal Medicine

## 2011-03-08 ENCOUNTER — Ambulatory Visit (INDEPENDENT_AMBULATORY_CARE_PROVIDER_SITE_OTHER): Payer: Self-pay | Admitting: Internal Medicine

## 2011-03-08 VITALS — BP 158/94 | HR 84 | Temp 97.5°F | Wt 209.0 lb

## 2011-03-08 DIAGNOSIS — I10 Essential (primary) hypertension: Secondary | ICD-10-CM

## 2011-03-08 MED ORDER — LISINOPRIL 10 MG PO TABS: 10.0000 mg | ORAL_TABLET | Freq: Every day | ORAL | Status: DC

## 2011-03-08 NOTE — Patient Instructions (Signed)
Blood pressure - not controlled. Plan - start lisinopril 10 mg once a day. Watch for dry, hacky cough. Return for lab first week in December. Monitor your BP at home and call back. If not controlled we can make a change without an office visit.  Weight management - Go for it!!! Weight watchers for mother and daughter is a Statistician

## 2011-03-08 NOTE — Progress Notes (Signed)
  Subjective:    Patient ID: Aimee Anderson, female    DOB: 1965/08/16, 45 y.o.   MRN: [id number]  HPI Aimee Anderson presents for mgt of blood pressure which onb HCTZ alone is not well controlled. She has had no symptoms: no HA, nosebleeds, visual change.  She is going to work on weight loss - may join [employer].  I have reviewed the patient's medical history in detail and updated the computerized patient record.    Review of Systems  Constitutional: Negative.   HENT: Negative.   Eyes: Negative.   Respiratory: Negative.   Cardiovascular: Positive for chest pain.  Gastrointestinal: Negative.   Musculoskeletal: Negative.   Neurological: Negative.        Objective:   Physical Exam Vitals reviewed BP elevated! Gen'l overweight nicely groomed AA woman in no distress HEENT - C&S clear, PERRLA Cor- RRR Pulm - normal respirations        Assessment & Plan:

## 2011-03-08 NOTE — Assessment & Plan Note (Signed)
Poor control on diuretic alone.  Plan - lisinopril 10 mg once a day           Check BP and report back.

## 2011-05-27 ENCOUNTER — Other Ambulatory Visit (INDEPENDENT_AMBULATORY_CARE_PROVIDER_SITE_OTHER): Payer: Self-pay

## 2011-05-27 DIAGNOSIS — I10 Essential (primary) hypertension: Secondary | ICD-10-CM

## 2011-05-27 LAB — COMPREHENSIVE METABOLIC PANEL
ALT: 14 U/L (ref 0–35)
AST: 16 U/L (ref 0–37)
Albumin: 3.8 g/dL (ref 3.5–5.2)
Alkaline Phosphatase: 38 U/L — ABNORMAL LOW (ref 39–117)
BUN: 10 mg/dL (ref 6–23)
CO2: 29 mEq/L (ref 19–32)
Calcium: 9.1 mg/dL (ref 8.4–10.5)
Chloride: 106 mEq/L (ref 96–112)
Creatinine, Ser: 0.8 mg/dL (ref 0.4–1.2)
GFR: 97.23 mL/min (ref >60.00)
Glucose, Bld: 92 mg/dL (ref 70–99)
Potassium: 3.4 mEq/L — ABNORMAL LOW (ref 3.5–5.1)
Sodium: 140 mEq/L (ref 135–145)
Total Bilirubin: 0.9 mg/dL (ref 0.3–1.2)
Total Protein: 7 g/dL (ref 6.0–8.3)

## 2011-05-28 ENCOUNTER — Encounter: Payer: Self-pay | Admitting: Internal Medicine

## 2011-07-15 ENCOUNTER — Other Ambulatory Visit: Payer: Self-pay | Admitting: Obstetrics & Gynecology

## 2011-07-15 DIAGNOSIS — Z1231 Encounter for screening mammogram for malignant neoplasm of breast: Secondary | ICD-10-CM

## 2011-07-21 ENCOUNTER — Encounter: Payer: Self-pay | Admitting: Advanced Practice Midwife

## 2011-07-21 ENCOUNTER — Ambulatory Visit (INDEPENDENT_AMBULATORY_CARE_PROVIDER_SITE_OTHER): Payer: Self-pay | Admitting: Advanced Practice Midwife

## 2011-07-21 VITALS — BP 112/80 | HR 98 | Temp 97.6°F | Ht 64.0 in | Wt 179.6 lb

## 2011-07-21 DIAGNOSIS — N920 Excessive and frequent menstruation with regular cycle: Secondary | ICD-10-CM

## 2011-07-21 DIAGNOSIS — Z01419 Encounter for gynecological examination (general) (routine) without abnormal findings: Secondary | ICD-10-CM

## 2011-07-21 DIAGNOSIS — K649 Unspecified hemorrhoids: Secondary | ICD-10-CM

## 2011-07-21 LAB — CBC
HCT: 39.1 % (ref 36.0–46.0)
Hemoglobin: 13.6 g/dL (ref 12.0–15.0)
MCH: 27.8 pg (ref 26.0–34.0)
MCHC: 34.8 g/dL (ref 30.0–36.0)
MCV: 80 fL (ref 78.0–100.0)
Platelets: 267 10*3/uL (ref 150–400)
RBC: 4.89 MIL/uL (ref 3.87–5.11)
RDW: 13.9 % (ref 11.5–15.5)
WBC: 7.3 10*3/uL (ref 4.0–10.5)

## 2011-07-21 NOTE — Progress Notes (Addendum)
Patient ID: Aimee Anderson, female   DOB: 1965/07/07, 46 y.o.   MRN: [id number] Subjective:     Aimee Anderson is a [id number] 46 y.o. female here for a routine exam.  Current complaints: heavy regular fibroid bleeding, possible hemorrhoids and request for annual Pap smear. She denies every having an abnormal Pap smear. In regards to her heavy bleeding due to fibroids, she reports having an "annoying" heavy menstrual bleeding every 21 days. She denies assoicated pain as well as intermittent spotting/bleeding in between cycles. She states she has used OCPs in the past with good results and that she would like discuss options for dealing with fibroids and the associated symptoms during today's visit. In regards to her possible hemorrhoids, she reports feeling "them" for the last few months especially when bearing down. She denies any bleeding or trouble with BM but does states that she feels the hemorrhoids more when bearing down for a BM. In regards to GYN health maintenance, she denies any abnormal breast symptoms, states that she does get annual mammogram and denies any concerns for her breasts other than a maternal fm hx of breast cancer (grandmother).  Pt denies Hx of blood clots and is a nonsmoker.   Gynecologic History Patient's last menstrual period was 07/04/2011. Contraception: none Last Pap: 2 years ago. Results were: normal Last mammogram: 1. Results were: normal  Obstetric History OB History    Grav Para Term Preterm Abortions TAB SAB Ect Mult Living   5 3 3  0 1 0 1 0 0 2     # Outc Date GA Lbr Len/2nd Wgt Sex Del Anes PTL Lv   1 TRM            2 TRM            3 TRM            4 SAB            5 CUR                The following portions of the patient's history were reviewed and updated as appropriate: She  has a past medical history of ALLERGY, HX OF (06/22/2007); FLANK PAIN, RIGHT (04/15/2009); HYPERTENSION (06/22/2007); and TONSILLECTOMY, HX OF (06/22/2007). She  does not have  any pertinent problems on file. She  has past surgical history that includes Tubal ligation and Tonsillectomy. Her family history includes Gout in her father and Ulcers in her father. She  reports that she has never smoked. She does not have any smokeless tobacco history on file. She reports that she does not drink alcohol or use illicit drugs. She has a current medication list which includes the following prescription(s): hydrochlorothiazide and lisinopril. She is allergic to penicillins..  Review of Systems A comprehensive review of systems was negative.    Objective:    BP 112/80  Pulse 98  Temp(Src) 97.6 F (36.4 C) (Oral)  Ht 5\' 4"  (1.626 m)  Wt 81.466 kg (179 lb 9.6 oz)  BMI 30.83 kg/m2  LMP 07/04/2011  General Appearance:    Alert, cooperative, no distress, appears stated age  Head:    Normocephalic, without obvious abnormality, atraumatic  Eyes:    PERRL, conjunctiva/corneas clear,        Nose:   Nares normal  Throat:   Lips, mucosa, and tongue normal; teeth and gums normal  Neck:   Supple, symmetrical, trachea midline, no adenopathy;    thyroid:  no enlargement/tenderness/nodules  Back:  No CVA tenderness  Lungs:     Clear to auscultation bilaterally, respirations unlabored  Chest Wall:    No tenderness or deformity   Heart:    Regular rate and rhythm, S1 and S2 normal, no murmur, rub   or gallop  Breast Exam:    No tenderness, masses, or nipple abnormality  Abdomen:     Soft, non-tender, bowel sounds active all four quadrants,    no masses, no organomegaly  Genitalia:    Normal female without lesion, discharge or tenderness. Uterus NT, normal size, no CMT, adnexal masses. Cervix pink and smooth.  Rectal:    Normal tone, no tenderness. Small anterior external hemorrhoid and possible internal hemorrhoid palpated     Extremities:   Extremities normal, atraumatic, no cyanosis or edema     Skin:   Skin color, texture, turgor normal, no rashes or lesions  Lymph  nodes:   Cervical, supraclavicular, and axillary nodes normal  Neurologic:  Grossly intact, normal strength and sensation.      Assessment/Plan:  .   1. Well woman exam with routine gynecological exam  CBC, Cytology - PAP  2. Hemorrhoid  hydrocortisone-pramoxine (PROCTOFOAM HC) rectal foam  3. Menorrhagia  norgestimate-ethinyl estradiol (ORTHO-CYCLEN,SPRINTEC,PREVIFEM) 0.25-35 MG-MCG tablet  Options for menorrhagia/fibroid management were discussed in length during today's visit including OCPs, ablation and hysterectomy. Pt decided to think about the options at home and call or return with her decision. Handouts given.  F/u in one year for regular GYN exam.  F/U w/ Mammogram as scheduled.  Plan:    Education reviewed: low fat, low cholesterol diet, safe sex/STD prevention, self breast exams, weight bearing exercise and fibroid manangment. .    I performed the entire exam.  Katrinka Blazing, [place] 07/21/2011

## 2011-07-21 NOTE — Patient Instructions (Signed)
Menorrhagia Dysfunctional uterine bleeding is different from a normal menstrual period. When periods are heavy or there is more bleeding than is usual for you, it is called menorrhagia. It may be caused by hormonal imbalance, or physical, metabolic, or other problems. Examination is necessary in order that your caregiver may treat treatable causes. If this is a continuing problem, a D&C may be needed. That means that the cervix (the opening of the uterus or womb) is dilated (stretched larger) and the lining of the uterus is scraped out. The tissue scraped out is then examined under a microscope by a specialist (pathologist) to make sure there is nothing of concern that needs further or more extensive treatment. HOME CARE INSTRUCTIONS   If medications were prescribed, take exactly as directed. Do not change or switch medications without consulting your caregiver.   Long term heavy bleeding may result in iron deficiency. Your caregiver may have prescribed iron pills. They help replace the iron your body lost from heavy bleeding. Take exactly as directed. Iron may cause constipation. If this becomes a problem, increase the bran, fruits, and roughage in your diet.   Do not take aspirin or medicines that contain aspirin one week before or during your menstrual period. Aspirin may make the bleeding worse.   If you need to change your sanitary pad or tampon more than once every 2 hours, stay in bed and rest as much as possible until the bleeding stops.  Eat well-balanced meals. Eat foods high in iron. Examples are leafy green vegetables, meat, liver, eggs, and whole grain breads and cereals. Do not try to lose weight until the abnormal bleeding has stopped and your blood iron level is back to normal.   Hysterectomy Information  A hysterectomy is a procedure where your uterus is surgically removed. It will no longer be possible to have menstrual periods or to become pregnant. The tubes and ovaries can be  removed (bilateral salpingo-oopherectomy) during this surgery as well.  REASONS FOR A HYSTERECTOMY  Persistent, abnormal bleeding.   Lasting (chronic) pelvic pain or infection.   The lining of the uterus (endometrium) starts growing outside the uterus (endometriosis).  The endometrium starts growing in the muscle of the uterus (adenomyosis).   Endometrial Ablation Endometrial ablation removes the lining of the uterus (endometrium). It is usually a same day, outpatient treatment. Ablation helps avoid major surgery (such as a hysterectomy). A hysterectomy is removal of the cervix and uterus. Endometrial ablation has less risk and complications, has a shorter recovery period and is less expensive. After endometrial ablation, most women will have little or no menstrual bleeding. You may not keep your fertility. Pregnancy is no longer likely after this procedure but if you are pre-menopausal, you still need to use a reliable method of birth control following the procedure because pregnancy can occur. REASONS TO HAVE THE PROCEDURE MAY INCLUDE: Heavy periods.  Bleeding that is causing anemia.  Anovulatory bleeding, very irregular, bleeding.  Bleeding submucous fibroids (on the lining inside the uterus) if they are smaller than 3 centimeters.  REASONS NOT TO HAVE THE PROCEDURE MAY INCLUDE: You wish to have more children.  You have a pre-cancerous or cancerous problem. The cause of any abnormal bleeding must be diagnosed before having the procedure.  You have pain coming from the uterus.  You have a submucus fibroid larger than 3 centimeters.  You recently had a baby.  You recently had an infection in the uterus.  You have a severe retro-flexed, tipped uterus  and cannot insert the instrument to do the ablation.  You had a Cesarean section or deep major surgery on the uterus.  The inner cavity of the uterus is too large for the endometrial ablation instrument.  RISKS AND COMPLICATIONS  Perforation  of the uterus.  Bleeding.  Infection of the uterus, bladder or vagina.  Injury to surrounding organs.  Cutting the cervix.  An air bubble to the lung (air embolus).  Pregnancy following the procedure.  Failure of the procedure to help the problem requiring hysterectomy.  Decreased ability to diagnose cancer in the lining of the uterus.  BEFORE THE PROCEDURE The lining of the uterus must be tested to make sure there is no pre-cancerous or cancer cells present.  Medications may be given to make the lining of the uterus thinner.  Ultrasound may be used to evaluate the size and look for abnormalities of the uterus.  Future pregnancy is not desired.  PROCEDURE  There are different ways to destroy the lining of the uterus.  Resectoscope - radio frequency-alternating electric current is the most common one used.  Cryotherapy - freezing the lining of the uterus.  Heated Free Liquid - heated salt (saline) solution inserted into the uterus.  Microwave - uses high energy microwaves in the uterus.  Thermal Balloon - a catheter with a balloon tip is inserted into the uterus and filled with heated fluid.  Your caregiver will talk with you about the method used in this clinic. They will also instruct you on the pros and cons of the procedure. Endometrial ablation is performed along with a procedure called operative hysteroscopy. A narrow viewing tube is inserted through the birth canal (vagina) and through the cervix into the uterus. A tiny camera attached to the viewing tube (hysteroscope) allows the uterine cavity to be shown on a TV monitor during surgery. Your uterus is filled with a harmless liquid to make the procedure easier. The lining of the uterus is then removed. The lining can also be removed with a resectoscope which allows your surgeon to cut away the lining of the uterus under direct vision. Usually, you will be able to go home within an hour after the procedure. HOME CARE INSTRUCTIONS  Do not  drive for 24 hours.  No tampons, douching or intercourse for 2 weeks or until your caregiver approves.  Rest at home for 24 to 48 hours. You may then resume normal activities unless told differently by your caregiver.  Take your temperature two times a day for 4 days, and record it.  Take any medications your caregiver has ordered, as directed.  Use some form of contraception if you are pre-menopausal and do not want to get pregnant.  Bleeding after the procedure is normal. It varies from light spotting and mildly watery to bloody discharge for 4 to 6 weeks. You may also have mild cramping. Only take over-the-counter or prescription medicines for pain, discomfort, or fever as directed by your caregiver. Do not use aspirin, as this may aggravate bleeding. Frequent urination during the first 24 hours is normal. You will not know how effective your surgery is until at least 3 months after the surgery. SEEK IMMEDIATE MEDICAL CARE IF:  Bleeding is heavier than a normal menstrual cycle.  An oral temperature above 102 F (38.9 C) develops.  You have increasing cramps or pains not relieved with medication or develop belly (abdominal) pain which does not seem to be related to the same area of earlier cramping and pain.  You are light headed, weak or have fainting episodes.  You develop pain in the shoulder strap areas.  You have chest or leg pain.  You have abnormal vaginal discharge.  You have painful urination.  Document Released: 02/20/2004 Document Revised: 04/01/2011 Document Reviewed: 05/20/2007  [facility] Patient Information 2012 [place], [place].  The uterus falls down into the vagina (pelvic organ prolapse).   Symptomatic uterine fibroids.   Precancerous cells.   Cervical cancer or uterine cancer.  TYPES OF HYSTERECTOMIES  Supracervical hysterectomy. This type removes the top part of the uterus, but not the cervix.   Total hysterectomy. This type removes the uterus and cervix.    Radical hysterectomy. This type removes the uterus, cervix, and the fibrous tissue that holds the uterus in place in the pelvis (parametrium).  WAYS A HYSTERECTOMY CAN BE PERFORMED  Abdominal hysterectomy. A large surgical cut (incision) is made in the abdomen. The uterus is removed through this incision.   Vaginal hysterectomy. An incision is made in the vagina. The uterus is removed through this incision. There are no abdominal incisions.   Conventional laparoscopic hysterectomy. A thin, lighted tube with a camera (laparoscope) is inserted into 3 or 4 small incisions in the abdomen. The uterus is cut into small pieces. The small pieces are removed through the incisions, or they are removed through the vagina.   Laparoscopic assisted vaginal hysterectomy (LAVH). Three or four small incisions are made in the abdomen. Part of the surgery is performed laparoscopically and part vaginally. The uterus is removed through the vagina.   Robot-assisted laparoscopic hysterectomy. A laparoscope is inserted into 3 or 4 small incisions in the abdomen. A computer-controlled device is used to give the surgeon a 3D image. This allows for more precise movements of surgical instruments. The uterus is cut into small pieces and removed through the incisions or removed through the vagina.  RISKS OF HYSTERECTOMY   Bleeding and risk of blood transfusion. Tell your caregiver if you do not want to receive any blood products.   Blood clots in the legs or lung.   Infection.   Injury to surrounding organs.   Anesthesia problems or side effects.   Conversion to an abdominal hysterectomy.  WHAT TO EXPECT AFTER A HYSTERECTOMY  You will be given pain medicine.   You will need to have someone with you for the first 3 to 5 days after you go home.   You will need to follow up with your surgeon in 2 to 4 weeks after surgery to evaluate your progress.   You may have early menopause symptoms like hot flashes, night  sweats, and insomnia.   If you had a hysterectomy for a problem that was not a cancer or a condition that could lead to cancer, then you no longer need Pap tests. However, even if you no longer need a Pap test, a regular exam is a good idea to make sure no other problems are starting.  Document Released: 10/06/2000 Document Revised: 04/01/2011 Document Reviewed: 11/21/2010  [facility] Patient Information 2012 [place], [place]. SEEK MEDICAL CARE IF:   You need to change your sanitary pad or tampon more than once an hour.   You develop nausea (feeling sick to your stomach) and vomiting, dizziness, or diarrhea while you are taking your medicine.   You have any problems that may be related to the medicine you are taking.  SEEK IMMEDIATE MEDICAL CARE IF:   You have a fever.   You develop chills.  You develop severe bleeding or start to pass blood clots.   You feel dizzy or faint.  MAKE SURE YOU:   Understand these instructions.   Will watch your condition.   Will get help right away if you are not doing well or get worse.  Document Released: 04/12/2005 Document Revised: 04/01/2011 Document Reviewed: 12/01/2007 [facility] Patient Information 2012 [place], [place].

## 2011-07-22 ENCOUNTER — Encounter: Payer: Self-pay | Admitting: Advanced Practice Midwife

## 2011-07-22 DIAGNOSIS — K649 Unspecified hemorrhoids: Secondary | ICD-10-CM | POA: Insufficient documentation

## 2011-07-22 MED ORDER — HYDROCORTISONE ACE-PRAMOXINE 1-1 % RE FOAM: 1.0000 | Freq: Two times a day (BID) | RECTAL | Status: AC

## 2011-07-22 MED ORDER — NORGESTIMATE-ETH ESTRADIOL 0.25-35 MG-MCG PO TABS: 1.0000 | ORAL_TABLET | Freq: Every day | ORAL | Status: DC

## 2011-07-22 NOTE — Progress Notes (Signed)
Addended by: Dorathy Kinsman on: 07/22/2011 05:57 PM   Modules accepted: Orders

## 2011-08-16 ENCOUNTER — Ambulatory Visit (HOSPITAL_COMMUNITY)
Admission: RE | Admit: 2011-08-16 | Discharge: 2011-08-16 | Disposition: A | Discharge status: Home / Self Care | Payer: Self-pay | Source: Ambulatory Visit | Attending: Obstetrics & Gynecology | Admitting: Obstetrics & Gynecology

## 2011-08-16 DIAGNOSIS — Z1231 Encounter for screening mammogram for malignant neoplasm of breast: Secondary | ICD-10-CM

## 2012-02-15 ENCOUNTER — Other Ambulatory Visit: Payer: Self-pay | Admitting: Internal Medicine

## 2012-03-02 ENCOUNTER — Other Ambulatory Visit: Payer: Self-pay | Admitting: Internal Medicine

## 2012-04-12 ENCOUNTER — Ambulatory Visit (INDEPENDENT_AMBULATORY_CARE_PROVIDER_SITE_OTHER): Payer: Self-pay | Admitting: Internal Medicine

## 2012-04-12 ENCOUNTER — Encounter: Payer: Self-pay | Admitting: Internal Medicine

## 2012-04-12 VITALS — BP 132/82 | HR 64 | Temp 98.0°F | Ht 65.0 in | Wt 180.0 lb

## 2012-04-12 DIAGNOSIS — H6120 Impacted cerumen, unspecified ear: Secondary | ICD-10-CM

## 2012-04-12 NOTE — Patient Instructions (Addendum)

## 2012-04-12 NOTE — Progress Notes (Signed)
Subjective:    Patient ID: Aimee Anderson, female    DOB: 11-28-1965, 46 y.o.   MRN: [id number]  HPI  Pt presents to the clinic today with c/o ear pain. This is mostly on the right side. She also c/o decreased hearing on that side. She feels like her ears are full of wax. She does have a problem with wax buildup and does have to come to the doctor to get the wax out.  Review of Systems  Past Medical History  Diagnosis Date  . ALLERGY, HX OF 06/22/2007  . FLANK PAIN, RIGHT 04/15/2009  . HYPERTENSION 06/22/2007  . TONSILLECTOMY, HX OF 06/22/2007    Current Outpatient Prescriptions  Medication Sig Dispense Refill  . hydrochlorothiazide (HYDRODIURIL) 25 MG tablet TAKE ONE-HALF TABLET BY MOUTH EVERY DAY  45 tablet  0  . lisinopril (PRINIVIL,ZESTRIL) 10 MG tablet TAKE 1 TABLET EVERY DAY  30 tablet  5  . norgestimate-ethinyl estradiol (ORTHO-CYCLEN,SPRINTEC,PREVIFEM) 0.25-35 MG-MCG tablet Take 1 tablet by mouth daily.  1 Package  13    Allergies  Allergen Reactions  . Penicillins     Family History  Problem Relation Age of Onset  . Ulcers Father   . Gout Father     History   Social History  . Marital Status: Married    Spouse Name: N/A    Number of Children: N/A  . Years of Education: N/A   Occupational History  . Not on file.   Social History Main Topics  . Smoking status: Never Smoker   . Smokeless tobacco: Not on file  . Alcohol Use: No  . Drug Use: No  . Sexually Active:    Other Topics Concern  . 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  HEENT: Pt reports ear pain. Denies eye pain, eye redness, ear pain, ringing in the ears, wax buildup, runny nose, nasal congestion, bloody nose, or sore throat. Respiratory: Denies difficulty breathing, shortness of breath,  cough or sputum production.   Cardiovascular: Denies chest pain, chest tightness, palpitations or swelling in the hands or feet.  Skin: Denies redness, rashes, lesions or ulcercations.   No other specific complaints in a complete review of systems (except as listed in HPI above).     Objective:   Physical Exam   BP 132/82  Pulse 64  Temp 98 F (36.7 C) (Oral)  Ht 5\' 5"  (1.651 m)  Wt 180 lb (81.647 kg)  BMI 29.95 kg/m2  SpO2 98% Wt Readings from Last 3 Encounters:  04/12/12 180 lb (81.647 kg)  07/21/11 179 lb 9.6 oz (81.466 kg)  03/08/11 209 lb (94.802 kg)    General: Appears her stated age, well developed, well nourished in NAD. Skin: Warm, dry and intact. No rashes, lesions or ulcerations noted. HEENT: Head: normal shape and size; Eyes: sclera white, no icterus, conjunctiva pink, PERRLA and EOMs intact; Ears: both ears with cerumen impaction; Nose: mucosa pink and moist, septum midline; Throat/Mouth: Teeth present, mucosa pink and moist, no exudate, lesions or ulcerations noted.  Cardiovascular: Normal rate and rhythm. S1,S2 noted.  No murmur, rubs or gallops noted. No JVD or BLE edema. No carotid bruits noted. Pulmonary/Chest: Normal effort and positive vesicular breath sounds. No respiratory distress. No wheezes, rales or ronchi noted.        Assessment & Plan:  Ear pain, due to cerumen impaction, new onset with additional workup required:  Can use OTC debrox a couple of times a week Ear lavage bilaterally today  RTC as needed or if symptoms persist

## 2012-04-24 ENCOUNTER — Encounter: Payer: Self-pay | Admitting: Internal Medicine

## 2012-04-24 ENCOUNTER — Ambulatory Visit (INDEPENDENT_AMBULATORY_CARE_PROVIDER_SITE_OTHER): Payer: Self-pay | Admitting: Internal Medicine

## 2012-04-24 VITALS — BP 122/78 | HR 74 | Temp 98.4°F | Resp 12 | Wt 179.0 lb

## 2012-04-24 DIAGNOSIS — K2970 Gastritis, unspecified, without bleeding: Secondary | ICD-10-CM

## 2012-04-24 DIAGNOSIS — K2990 Gastroduodenitis, unspecified, without bleeding: Secondary | ICD-10-CM

## 2012-04-24 DIAGNOSIS — R1032 Left lower quadrant pain: Secondary | ICD-10-CM

## 2012-04-24 NOTE — Patient Instructions (Addendum)
Upper abdominal pain is very consistent with acid related pain, e.g. Gastritis. This can be exacerbated by worry and stress. Plan  Nexium 40 mg one every AM before breakfast x 10 days and then if you have seen an improvement you can switch to over the counter        prilosec for an additional 2 weeks and then as needed.  Left lower quadrant abdominal pain - not consistent with infection, e.g. Diverticulitis but the only organ in that region is the sigmoid colon. Plan Refer to Dr. Marina Goodell for evaluation. You are 9 years out from your last colonosocpy anyway.

## 2012-04-24 NOTE — Progress Notes (Signed)
  Subjective:    Patient ID: Aimee Anderson, female    DOB: 02/24/1966, 46 y.o.   MRN: [id number]  HPI Aimee Anderson presents for LUQ abdominal pain that is aggravating. She has no change in  Bowel: no blood or mucus, normal color. No related to any foods or eating, no heartburn or reflux. No upper Abdominal bloating.   Past Medical History  Diagnosis Date  . ALLERGY, HX OF 06/22/2007  . FLANK PAIN, RIGHT 04/15/2009  . HYPERTENSION 06/22/2007  . TONSILLECTOMY, HX OF 06/22/2007   Past Surgical History  Procedure Date  . Tubal ligation   . Tonsillectomy    Family History  Problem Relation Age of Onset  . Ulcers Father   . Gout Father    History   Social History  . Marital Status: Married    Spouse Name: N/A    Number of Children: N/A  . Years of Education: N/A   Occupational History  . Not on file.   Social History Main Topics  . Smoking status: Never Smoker   . Smokeless tobacco: Not on file  . Alcohol Use: No  . Drug Use: No  . Sexually Active:    Other Topics Concern  . 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 hydrochlorothiazide (HYDRODIURIL) 25 MG tablet TAKE ONE-HALF TABLET BY MOUTH EVERY DAY  45 tablet  0  . lisinopril (PRINIVIL,ZESTRIL) 10 MG tablet TAKE 1 TABLET EVERY DAY  30 tablet  5      Review of Systems System review is negative for any constitutional, cardiac, pulmonary, GI or neuro symptoms or complaints other than as described in the HPI.     Objective:   Physical Exam Filed Vitals:   04/24/12 1421  BP: 122/78  Pulse: 74  Temp: 98.4 F (36.9 C)  Resp: 12   Gen'l - WNWD AA woman in no distress. HEENT - [place]/AT, C&S clear Cor - RRR Pulm - Normal respirations Abd- BS+, tender in the epigastrium to deep palpation, tender in the  LLQ Neuro - non focal       Assessment & Plan:  1. Left lower quadrant abdominal pain - not consistent with infection, e.g. Diverticulitis but the only organ in that region is the sigmoid colon. Plan Refer to Dr. Marina Anderson for evaluation. You are 9 years out from your last colonosocpy anyway.

## 2012-04-26 DIAGNOSIS — K2970 Gastritis, unspecified, without bleeding: Secondary | ICD-10-CM | POA: Insufficient documentation

## 2012-04-26 NOTE — Assessment & Plan Note (Signed)
Upper abdominal pain is very consistent with acid related pain, e.g. Gastritis. This can be exacerbated by worry and stress. Plan  Nexium 40 mg one every AM before breakfast x 10 days and then if you have seen an improvement you can switch to over the counter        prilosec for an additional 2 weeks and then as needed.

## 2012-05-12 ENCOUNTER — Encounter: Payer: Self-pay | Admitting: Internal Medicine

## 2012-05-12 ENCOUNTER — Ambulatory Visit (INDEPENDENT_AMBULATORY_CARE_PROVIDER_SITE_OTHER): Payer: Self-pay | Admitting: Internal Medicine

## 2012-05-12 VITALS — BP 126/88 | HR 80 | Ht 64.0 in | Wt 179.4 lb

## 2012-05-12 DIAGNOSIS — Z800 Family history of malignant neoplasm of digestive organs: Secondary | ICD-10-CM

## 2012-05-12 DIAGNOSIS — R1032 Left lower quadrant pain: Secondary | ICD-10-CM

## 2012-05-12 NOTE — Patient Instructions (Addendum)
You have been scheduled for a CT scan of the abdomen and pelvis at [facility] ([address]---this is in the same building as [facility]).   You are scheduled on 05-16-12 at 10:00am. You should arrive 15 minutes prior to your appointment time for registration. Please follow the written instructions below on the day of your exam:  WARNING: IF YOU ARE ALLERGIC TO IODINE/X-RAY DYE, PLEASE NOTIFY RADIOLOGY IMMEDIATELY AT [phone]! YOU WILL BE GIVEN A 13 HOUR PREMEDICATION PREP.  1) Do not eat or drink anything after 6:00am (4 hours prior to your test) 2) You have been given 2 bottles of oral contrast to drink. The solution may taste better if refrigerated, but do NOT add ice or any other liquid to this solution. Shake  well before drinking.    Drink 1 bottle of contrast @ 8:00am (2 hours prior to your exam)  Drink 1 bottle of contrast @ 9:00am (1 hour prior to your exam)  You may take any medications as prescribed with a small amount of water except for the following: Metformin, Glucophage, Glucovance, Avandamet, Riomet, Fortamet, Actoplus Met, Janumet, Glumetza or Metaglip. The above medications must be held the day of the exam AND 48 hours after the exam.  The purpose of you drinking the oral contrast is to aid in the visualization of your intestinal tract. The contrast solution may cause some diarrhea. Before your exam is started, you will be given a small amount of fluid to drink. Depending on your individual set of symptoms, you may also receive an intravenous injection of x-ray contrast/dye. Plan on being at [facility] for 30 minutes or long, depending on the type of exam you are having performed.  You have been scheduled for a colonoscopy with propofol. Please follow written instructions given to you at your visit today.  Please pick up your prep kit at the pharmacy within the next 1-3 days. If you use inhalers (even only as needed) or a CPAP machine, please  bring them with you on the day of your procedure.  If you have any questions regarding your exam or if you need to reschedule, you may call the CT department at [phone] between the hours of 8:00 am and 5:00 pm, Monday-Friday.  ________________________________________________________________________

## 2012-05-12 NOTE — Progress Notes (Signed)
HISTORY OF PRESENT ILLNESS:  Aimee Anderson is a 47 y.o. female with hypertension who is referred today by Dr. Debby Bud regarding persistent left-sided abdominal pain. The patient has a history of colon cancer in her brother diagnosed at age 29. She underwent initial colonoscopy in 1998 and again in 2004. Both examinations were unremarkable. She was due for followup in 2009 but did not respond to the recall letter that she received. She now reports a four-month history of left-sided abdominal pain. She initially thought that this was secondary to her recently started exercise regimen. She notices that the discomfort is worse when she is worried, after meals, and at night. She describes the discomfort as a nagging ache. It does not inhibit her from doing activities. She has had 20 pound weight loss which she attributes to exercise. She denies change in bowel habits or bleeding. Symptoms are somewhat improved after defecation. No other GI complaints. She is accompanied by her daughter. She has had no recent blood work. No formal evaluation for this complaint  REVIEW OF SYSTEMS:  All non-GI ROS negative except for headaches and depression  Past Medical History  Diagnosis Date  . ALLERGY, HX OF 06/22/2007  . FLANK PAIN, RIGHT 04/15/2009  . HYPERTENSION 06/22/2007  . Uterine fibroid     Past Surgical History  Procedure Date  . Tubal ligation   . Tonsillectomy   . Cesarean section     Social History GIBSON TELLERIA  reports that she has never smoked. She has never used smokeless tobacco. She reports that she does not drink alcohol or use illicit drugs.  family history includes Bladder Cancer in her father; Diabetes in her father; Gout in her father; Heart disease in her father; Hypertension in her mother; and Ulcers in her father.  Allergies  Allergen Reactions  . Penicillins        PHYSICAL EXAMINATION: Vital signs: BP 126/88  Pulse 80  Ht 5\' 4"  (1.626 m)  Wt 179 lb 6.4 oz (81.375 kg)   BMI 30.79 kg/m2  LMP 05/01/2012  Constitutional: generally well-appearing, no acute distress Psychiatric: alert and oriented x3, cooperative Eyes: extraocular movements intact, anicteric, conjunctiva pink Mouth: oral pharynx moist, no lesions Neck: supple no lymphadenopathy Cardiovascular: heart regular rate and rhythm, no murmur Lungs: clear to auscultation bilaterally Abdomen: soft, moderate tenderness in the left lower quadrant to palpation with some mild fullness, nondistended, no obvious ascites, no peritoneal signs, normal bowel sounds, no organomegaly Rectal: Deferred until colonoscopy Extremities: no lower extremity edema bilaterally Skin: no lesions on visible extremities Neuro: No focal deficits.    ASSESSMENT:  #1. 4 month history of persistent left lower quadrant discomfort. Tenderness on physical exam as noted. 20 pound weight loss, presumably due to exercise, though uncertain. #2. Family history of colon cancer in her brother at age 45. Prior colonoscopies in 1998 and 2004 were negative. Overdue for followup   PLAN:  #1. Contrast-enhanced CT scan of the abdomen and pelvis to evaluate persistent left lower quadrant pain, abdominal pain on physical exam, and weight loss #2. Colonoscopy for family history of colon cancer, and possibly evaluate abdominal complaints.The nature of the procedure, as well as the risks, benefits, and alternatives were carefully and thoroughly reviewed with the patient. Ample time for discussion and questions allowed. The patient understood, was satisfied, and agreed to proceed.

## 2012-05-15 ENCOUNTER — Telehealth: Payer: Self-pay | Admitting: Internal Medicine

## 2012-05-15 MED ORDER — MOVIPREP 100 G PO SOLR: 1.0000 | Freq: Once | ORAL | Status: DC

## 2012-05-15 NOTE — Telephone Encounter (Signed)
Sent moviprep as requested

## 2012-05-16 ENCOUNTER — Ambulatory Visit (INDEPENDENT_AMBULATORY_CARE_PROVIDER_SITE_OTHER)
Admission: RE | Admit: 2012-05-16 | Discharge: 2012-05-16 | Disposition: A | Discharge status: Home / Self Care | Payer: Self-pay | Source: Ambulatory Visit | Attending: Internal Medicine | Admitting: Internal Medicine

## 2012-05-16 DIAGNOSIS — Z800 Family history of malignant neoplasm of digestive organs: Secondary | ICD-10-CM

## 2012-05-16 DIAGNOSIS — R1032 Left lower quadrant pain: Secondary | ICD-10-CM

## 2012-05-16 MED ORDER — IOHEXOL 300 MG/ML  SOLN
100.0000 mL | Freq: Once | INTRAMUSCULAR | Status: AC | PRN
  Administered 2012-05-16: 100 mL via INTRAVENOUS

## 2012-05-17 ENCOUNTER — Ambulatory Visit: Payer: Self-pay | Admitting: Obstetrics & Gynecology

## 2012-05-31 ENCOUNTER — Ambulatory Visit (AMBULATORY_SURGERY_CENTER): Payer: Self-pay | Admitting: Internal Medicine

## 2012-05-31 ENCOUNTER — Encounter: Payer: Self-pay | Admitting: Internal Medicine

## 2012-05-31 VITALS — BP 123/80 | HR 52 | Temp 97.8°F | Resp 20 | Ht 64.0 in | Wt 179.0 lb

## 2012-05-31 DIAGNOSIS — Z800 Family history of malignant neoplasm of digestive organs: Secondary | ICD-10-CM

## 2012-05-31 DIAGNOSIS — R1032 Left lower quadrant pain: Secondary | ICD-10-CM

## 2012-05-31 DIAGNOSIS — Z1211 Encounter for screening for malignant neoplasm of colon: Secondary | ICD-10-CM

## 2012-05-31 MED ORDER — SODIUM CHLORIDE 0.9 % IV SOLN: 500.0000 mL | INTRAVENOUS | Status: DC

## 2012-05-31 NOTE — Patient Instructions (Signed)
Diverticulosis seen today. See handouts on diverticulosis and high fiber diet. Repeat colonoscopy in 5 years. Out patient follow up with your GYN to evaluate LLQ pain. Call us with any questions or concerns. Thank you!!  YOU HAD AN ENDOSCOPIC PROCEDURE TODAY AT THE [facility]: Refer to the procedure report that was given to you for any specific questions about what was found during the examination.  If the procedure report does not answer your questions, please call your gastroenterologist to clarify.  If you requested that your care partner not be given the details of your procedure findings, then the procedure report has been included in a sealed envelope for you to review at your convenience later.  YOU SHOULD EXPECT: Some feelings of bloating in the abdomen. Passage of more gas than usual.  Walking can help get rid of the air that was put into your GI tract during the procedure and reduce the bloating. If you had a lower endoscopy (such as a colonoscopy or flexible sigmoidoscopy) you may notice spotting of blood in your stool or on the toilet paper. If you underwent a bowel prep for your procedure, then you may not have a normal bowel movement for a few days.  DIET: Your first meal following the procedure should be a light meal and then it is ok to progress to your normal diet.  A half-sandwich or bowl of soup is an example of a good first meal.  Heavy or fried foods are harder to digest and may make you feel nauseous or bloated.  Likewise meals heavy in dairy and vegetables can cause extra gas to form and this can also increase the bloating.  Drink plenty of fluids but you should avoid alcoholic beverages for 24 hours.  ACTIVITY: Your care partner should take you home directly after the procedure.  You should plan to take it easy, moving slowly for the rest of the day.  You can resume normal activity the day after the procedure however you should NOT DRIVE or use heavy machinery for 24  hours (because of the sedation medicines used during the test).    SYMPTOMS TO REPORT IMMEDIATELY: A gastroenterologist can be reached at any hour.  During normal business hours, 8:30 AM to 5:00 PM Monday through Friday, call [phone].  After hours and on weekends, please call the GI answering service at [phone] who will take a message and have the physician on call contact you.   Following lower endoscopy (colonoscopy or flexible sigmoidoscopy):  Excessive amounts of blood in the stool  Significant tenderness or worsening of abdominal pains  Swelling of the abdomen that is new, acute  Fever of 100F or higher  FOLLOW UP: If any biopsies were taken you will be contacted by phone or by letter within the next 1-3 weeks.  Call your gastroenterologist if you have not heard about the biopsies in 3 weeks.  Our staff will call the home number listed on your records the next business day following your procedure to check on you and address any questions or concerns that you may have at that time regarding the information given to you following your procedure. This is a courtesy call and so if there is no answer at the home number and we have not heard from you through the emergency physician on call, we will assume that you have returned to your regular daily activities without incident.  SIGNATURES/CONFIDENTIALITY: You and/or your care partner have signed paperwork which will be  entered into your electronic medical record.  These signatures attest to the fact that that the information above on your After Visit Summary has been reviewed and is understood.  Full responsibility of the confidentiality of this discharge information lies with you and/or your care-partner.

## 2012-05-31 NOTE — Op Note (Signed)
[facility] [address] [place], [address]   COLONOSCOPY PROCEDURE REPORT  PATIENT: Aimee, Anderson  MR#: [id number] BIRTHDATE: 30-Apr-1965 , 46  yrs. old GENDER: Female ENDOSCOPIST: Roxy Cedar, MD REFERRED XB:[id number] Recall, PROCEDURE DATE:  05/31/2012 PROCEDURE:   Colonoscopy, screening ASA CLASS:   Class II INDICATIONS:Patient's immediate family history of colon cancer (brother 94); prior exams 1308,6578;   and abdominal pain in the lower left quadrant x 3 months. MEDICATIONS: MAC sedation, administered by CRNA and propofol (Diprivan) 300mg  IV  DESCRIPTION OF PROCEDURE:   After the risks benefits and alternatives of the procedure were thoroughly explained, informed consent was obtained.  A digital rectal exam revealed no abnormalities of the rectum.   The LB CF-H180AL [id number]  endoscope was introduced through the anus and advanced to the cecum, which was identified by both the appendix and ileocecal valve. No adverse events experienced.   The quality of the prep was excellent, using MoviPrep  The instrument was then slowly withdrawn as the colon was fully examined.      COLON FINDINGS: Moderate diverticulosis was noted in the sigmoid colon.   The colon was otherwise normal.  There was no diverticulosis, inflammation, polyps or cancers unless previously stated.  Retroflexed views revealed internal hemorrhoids. The time to cecum=2 minutes 50 seconds.  Withdrawal time=8 minutes 0 seconds.  The scope was withdrawn and the procedure completed. COMPLICATIONS: There were no complications.  ENDOSCOPIC IMPRESSION: 1.   Moderate diverticulosis was noted in the sigmoid colon 2.   The colon was otherwise normal  RECOMMENDATIONS: 1.  Follow up colonoscopy in 5 years (family hx) 2.  Out patient follow-up with your Gynecologist to evaluate LLQ pain.   eSigned:  Roxy Cedar, MD 05/31/2012 11:57 AM   cc: Jacques Navy, MD and The  Patient   PATIENT NAME:  Aimee, Anderson MR#: [id number]

## 2012-05-31 NOTE — Progress Notes (Signed)
Patient did not have preoperative order for IV antibiotic SSI prophylaxis. (G8918)  Patient did not experience any of the following events: a burn prior to discharge; a fall within the facility; wrong site/side/patient/procedure/implant event; or a hospital transfer or hospital admission upon discharge from the facility. (G8907)  

## 2012-06-01 ENCOUNTER — Ambulatory Visit: Payer: Self-pay | Admitting: Obstetrics & Gynecology

## 2012-06-01 ENCOUNTER — Telehealth: Payer: Self-pay

## 2012-06-01 NOTE — Telephone Encounter (Signed)
  Follow up Call-  Call back number 05/31/2012  Post procedure Call Back phone  # [phone]  Permission to leave phone message Yes     Patient questions:  Do you have a fever, pain , or abdominal swelling? no Pain Score  0 *  Have you tolerated food without any problems? yes  Have you been able to return to your normal activities? yes  Do you have any questions about your discharge instructions: Diet   no Medications  no Follow up visit  no  Do you have questions or concerns about your Care? no  Actions: * If pain score is 4 or above: No action needed, pain <4.

## 2012-06-06 ENCOUNTER — Other Ambulatory Visit: Payer: Self-pay | Admitting: Internal Medicine

## 2012-08-24 ENCOUNTER — Other Ambulatory Visit: Payer: Self-pay | Admitting: Internal Medicine

## 2012-10-18 ENCOUNTER — Other Ambulatory Visit: Payer: Self-pay | Admitting: Internal Medicine

## 2012-10-18 DIAGNOSIS — Z1231 Encounter for screening mammogram for malignant neoplasm of breast: Secondary | ICD-10-CM

## 2012-11-09 ENCOUNTER — Ambulatory Visit (HOSPITAL_COMMUNITY)
Admission: RE | Admit: 2012-11-09 | Discharge: 2012-11-09 | Disposition: A | Discharge status: Home / Self Care | Payer: Self-pay | Source: Ambulatory Visit | Attending: Internal Medicine | Admitting: Internal Medicine

## 2012-11-09 DIAGNOSIS — Z1231 Encounter for screening mammogram for malignant neoplasm of breast: Secondary | ICD-10-CM

## 2012-11-24 ENCOUNTER — Other Ambulatory Visit: Payer: Self-pay | Admitting: Internal Medicine

## 2013-02-27 ENCOUNTER — Other Ambulatory Visit: Payer: Self-pay | Admitting: Internal Medicine

## 2013-03-02 ENCOUNTER — Other Ambulatory Visit: Payer: Self-pay | Admitting: Internal Medicine

## 2013-04-04 ENCOUNTER — Other Ambulatory Visit: Payer: Self-pay | Admitting: Internal Medicine

## 2013-04-05 ENCOUNTER — Encounter: Payer: Self-pay | Admitting: Internal Medicine

## 2013-04-05 ENCOUNTER — Other Ambulatory Visit (INDEPENDENT_AMBULATORY_CARE_PROVIDER_SITE_OTHER): Payer: Self-pay

## 2013-04-05 ENCOUNTER — Ambulatory Visit (INDEPENDENT_AMBULATORY_CARE_PROVIDER_SITE_OTHER): Payer: Self-pay | Admitting: Internal Medicine

## 2013-04-05 VITALS — BP 140/86 | HR 59 | Temp 97.1°F | Wt 164.0 lb

## 2013-04-05 DIAGNOSIS — Z0000 Encounter for general adult medical examination without abnormal findings: Secondary | ICD-10-CM

## 2013-04-05 DIAGNOSIS — I10 Essential (primary) hypertension: Secondary | ICD-10-CM

## 2013-04-05 DIAGNOSIS — Z23 Encounter for immunization: Secondary | ICD-10-CM

## 2013-04-05 LAB — COMPREHENSIVE METABOLIC PANEL
ALT: 16 U/L (ref 0–35)
AST: 19 U/L (ref 0–37)
Albumin: 4.1 g/dL (ref 3.5–5.2)
Alkaline Phosphatase: 38 U/L — ABNORMAL LOW (ref 39–117)
BUN: 9 mg/dL (ref 6–23)
CO2: 30 mEq/L (ref 19–32)
Calcium: 9.1 mg/dL (ref 8.4–10.5)
Chloride: 101 mEq/L (ref 96–112)
Creatinine, Ser: 0.9 mg/dL (ref 0.4–1.2)
GFR: 85.43 mL/min (ref >60.00)
Glucose, Bld: 87 mg/dL (ref 70–99)
Potassium: 3.4 mEq/L — ABNORMAL LOW (ref 3.5–5.1)
Sodium: 137 mEq/L (ref 135–145)
Total Bilirubin: 1.5 mg/dL — ABNORMAL HIGH (ref 0.3–1.2)
Total Protein: 7.3 g/dL (ref 6.0–8.3)

## 2013-04-05 LAB — LIPID PANEL
Cholesterol: 192 mg/dL (ref 0–200)
HDL: 83 mg/dL (ref >39.00)
LDL Cholesterol: 98 mg/dL (ref 0–99)
Total CHOL/HDL Ratio: 2
Triglycerides: 56 mg/dL (ref 0.0–149.0)
VLDL: 11.2 mg/dL (ref 0.0–40.0)

## 2013-04-05 MED ORDER — LISINOPRIL 10 MG PO TABS: 10.0000 mg | ORAL_TABLET | Freq: Every day | ORAL | Status: DC

## 2013-04-05 MED ORDER — HYDROCHLOROTHIAZIDE 25 MG PO TABS: ORAL_TABLET | ORAL | Status: DC

## 2013-04-05 NOTE — Patient Instructions (Signed)
Good to see you. You look fabulous!  Your exam is normal.  Health maintenance - will give a Tdap today, good for 10 years. PAP every 2-3 yrs. Mammogram every 2 years.  Lab today: basic chemistry and cholesterol panel. Results will be posted to MyChart  Ear care: once every 2-4 weeks use ear wax removal drops, let dwell for 10 minutes then flush with warm water using a soft syringe (for Baby's nose).  Keep taking care of yourself.

## 2013-04-05 NOTE — Progress Notes (Signed)
Pre visit review using our clinic review tool, if applicable. No additional management support is needed unless otherwise documented below in the visit note. 

## 2013-04-09 DIAGNOSIS — Z0000 Encounter for general adult medical examination without abnormal findings: Secondary | ICD-10-CM | POA: Insufficient documentation

## 2013-04-09 NOTE — Assessment & Plan Note (Signed)
BP Readings from Last 3 Encounters:  04/05/13 140/86  05/31/12 123/80  05/12/12 126/88   Adequate control and will continue present medications. She is advised to be salt conscious in her diet.

## 2013-04-09 NOTE — Progress Notes (Signed)
Subjective:    Patient ID: Aimee Anderson, female    DOB: [birth year], 47 y.o.   MRN: [id number]  HPI Aimee Anderson presents for a medical wellness exam. In the past months she has been evaluated for LLQ abdominal pain including GI consult, CT abd/pelvis that was unremarkable, Colonoscopy that was unremarkable except for diverticulosis. She has seen her gynecologist with a negative exam. At this time she is feeling well w/o complaints.  Past Medical History  Diagnosis Date  . ALLERGY, HX OF 06/22/2007  . FLANK PAIN, RIGHT 04/15/2009  . HYPERTENSION 06/22/2007  . Uterine fibroid    Past Surgical History  Procedure Laterality Date  . Tubal ligation    . Tonsillectomy    . Cesarean section     Family History  Problem Relation Age of Onset  . Ulcers Father   . Gout Father   . Diabetes Father   . Heart disease Father   . Bladder Cancer Father   . Hypertension Mother    History   Social History  . Marital Status: Widowed    Spouse Name: N/A    Number of Children: 2  . Years of Education: N/A   Occupational History  . Self Employed    Social History Main Topics  . Smoking status: Never Smoker   . Smokeless tobacco: Never Used  . Alcohol Use: No  . Drug Use: No  . Sexual Activity: Not on file   Other Topics Concern  . 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  HEENT:  Negative for hearing loss, ear pain, congestion, neck stiffness and postnasal drip. Negative for sore throat or swallowing problems. Negative for dental complaints.   Eyes: Negative for vision loss or change in visual acuity.  Respiratory: Negative for chest tightness and wheezing. Negative for DOE.   Cardiovascular: Negative for chest pain or palpitations. No decreased exercise  tolerance Gastrointestinal: No change in bowel habit. No bloating or gas. No reflux or indigestion Genitourinary: Negative for urgency, frequency, flank pain and difficulty urinating.  Musculoskeletal: Negative for myalgias, back pain, arthralgias and gait problem.  Neurological: Negative for dizziness, tremors, weakness and headaches.  Hematological: Negative for adenopathy.  Psychiatric/Behavioral: Negative for behavioral problems and dysphoric mood.       Objective:   Physical Exam Filed Vitals:   04/05/13 1518  BP: 140/86  Pulse: 59  Temp: 97.1 F (36.2 C)   Wt Readings from Last 3 Encounters:  04/05/13 164 lb (74.39 kg)  05/31/12 179 lb (81.194 kg)  05/12/12 179 lb 6.4 oz (81.375 kg)   Gen'l: well nourished, well developed, nicely groomed Woman in no distress HEENT - [place]/AT, EACs/TMs normal, oropharynx with native dentition in good condition, no buccal or palatal lesions, posterior pharynx clear, mucous membranes moist. C&S clear, PERRLA, fundi - normal Neck - supple, no thyromegaly Nodes- negative submental, cervical, supraclavicular regions Chest - no deformity, no CVAT Lungs - clear without rales, wheezes. No increased work of breathing Breast - deferred to gyn Cardiovascular - regular rate and rhythm, quiet precordium, no murmurs, rubs or gallops, 2+ radial, DP and PT pulses Abdomen - BS+ x 4, no HSM, no guarding or rebound or tenderness Pelvic - deferred to gyn Rectal - deferred to gyn Extremities - no clubbing, cyanosis, edema  or deformity.  Neuro - A&O x 3, CN II-XII normal, motor strength normal and equal, DTRs 2+ and symmetrical biceps, radial, and patellar tendons. Cerebellar - no tremor, no rigidity, fluid movement and normal gait. Derm - Head, neck, back, abdomen and extremities without suspicious lesions  Recent Results (from the past 2160 hour(s))  COMPREHENSIVE METABOLIC PANEL     Status: Abnormal   Collection Time    04/05/13  4:19 PM      Result Value  Range   Sodium 137  135 - 145 mEq/L   Potassium 3.4 (*) 3.5 - 5.1 mEq/L   Chloride 101  96 - 112 mEq/L   CO2 30  19 - 32 mEq/L   Glucose, Bld 87  70 - 99 mg/dL   BUN 9  6 - 23 mg/dL   Creatinine, Ser 0.9  0.4 - 1.2 mg/dL   Total Bilirubin 1.5 (*) 0.3 - 1.2 mg/dL   Alkaline Phosphatase 38 (*) 39 - 117 U/L   AST 19  0 - 37 U/L   ALT 16  0 - 35 U/L   Total Protein 7.3  6.0 - 8.3 g/dL   Albumin 4.1  3.5 - 5.2 g/dL   Calcium 9.1  8.4 - 16.1 mg/dL   GFR 09.60  >45.40 mL/min  LIPID PANEL     Status: None   Collection Time    04/05/13  4:19 PM      Result Value Range   Cholesterol 192  0 - 200 mg/dL   Comment: ATP III Classification       Desirable:  < 200 mg/dL               Borderline High:  200 - 239 mg/dL          High:  > = 981 mg/dL   Triglycerides 19.1  0.0 - 149.0 mg/dL   Comment: Normal:  <478 mg/dLBorderline High:  150 - 199 mg/dL   HDL 29.56  >21.30 mg/dL   VLDL 86.5  0.0 - 78.4 mg/dL   LDL Cholesterol 98  0 - 99 mg/dL   Total CHOL/HDL Ratio 2     Comment:                Men          Women1/2 Average Risk     3.4          3.3Average Risk          5.0          4.42X Average Risk          9.6          7.13X Average Risk          15.0          11.0                               Assessment & Plan:

## 2013-04-09 NOTE — Assessment & Plan Note (Signed)
Interval history - work up for LLQ abdominal pain negative and symptoms have improved. Physical exam, sans breast and pelvic is normal. Labs reviewed - normal range including glucose and lipid. She is current gyn, colorectal and breast cancer screening. Immunization is brought up to date.  In summary -  A very nice woman who appears stable and healthy.

## 2013-10-15 ENCOUNTER — Other Ambulatory Visit: Payer: Self-pay

## 2013-10-15 MED ORDER — LISINOPRIL 10 MG PO TABS: 10.0000 mg | ORAL_TABLET | Freq: Every day | ORAL | Status: DC

## 2013-10-17 ENCOUNTER — Encounter: Payer: Self-pay | Admitting: Internal Medicine

## 2013-10-17 ENCOUNTER — Other Ambulatory Visit (INDEPENDENT_AMBULATORY_CARE_PROVIDER_SITE_OTHER): Payer: Self-pay

## 2013-10-17 ENCOUNTER — Other Ambulatory Visit: Payer: Self-pay | Admitting: Internal Medicine

## 2013-10-17 ENCOUNTER — Ambulatory Visit (INDEPENDENT_AMBULATORY_CARE_PROVIDER_SITE_OTHER): Payer: Self-pay | Admitting: Internal Medicine

## 2013-10-17 VITALS — BP 120/82 | HR 83 | Temp 98.2°F | Wt 175.2 lb

## 2013-10-17 DIAGNOSIS — I10 Essential (primary) hypertension: Secondary | ICD-10-CM

## 2013-10-17 LAB — BASIC METABOLIC PANEL
BUN: 12 mg/dL (ref 6–23)
CO2: 30 mEq/L (ref 19–32)
Calcium: 9.2 mg/dL (ref 8.4–10.5)
Chloride: 107 mEq/L (ref 96–112)
Creatinine, Ser: 0.9 mg/dL (ref 0.4–1.2)
GFR: 88.65 mL/min (ref >60.00)
Glucose, Bld: 82 mg/dL (ref 70–99)
Potassium: 3.8 mEq/L (ref 3.5–5.1)
Sodium: 140 mEq/L (ref 135–145)

## 2013-10-17 MED ORDER — HYDROCHLOROTHIAZIDE 25 MG PO TABS: ORAL_TABLET | ORAL | Status: DC

## 2013-10-17 MED ORDER — LISINOPRIL 10 MG PO TABS: 10.0000 mg | ORAL_TABLET | Freq: Every day | ORAL | Status: DC

## 2013-10-17 NOTE — Patient Instructions (Signed)
Minimal Blood Pressure Goal= AVERAGE < 140/90;  Ideal is an AVERAGE < 135/85. This AVERAGE should be calculated from @ least 5-7 BP readings taken @ different times of day on different days of week. You should not respond to isolated BP readings , but rather the AVERAGE for that week .Please bring your  blood pressure cuff to office visits to verify that it is reliable.It  can also be checked against the blood pressure device at the pharmacy. Finger or wrist cuffs are not dependable; an arm cuff is.  Your next office appointment will be determined based upon review of your pending labs . Those instructions will be transmitted to you through My Chart .

## 2013-10-17 NOTE — Progress Notes (Signed)
Pre visit review using our clinic review tool, if applicable. No additional management support is needed unless otherwise documented below in the visit note. 

## 2013-10-17 NOTE — Progress Notes (Signed)
   Subjective:    Patient ID: Aimee Anderson, female    DOB: 08/06/1965, 48 y.o.   MRN: [id number]  HPI    She has run out of her ACE inhibitor as a 6/22. She has not been monitoring her blood pressure for 6 months. Previously it average 120/80.  She denies any lightheadedness with the medicine; she has a minor cough. She does not feel this is significant enough to warrant changing her blood pressure medicines.  Both parents have hypertension. No family history of stroke.  She is a heart healthy low-salt diet.CVE 5X/ week for 60 min   Review of Systems     Significant headaches, epistaxis, chest pain, palpitations, exertional dyspnea, claudication, and paroxysmal nocturnal dyspnea absent.  She's had some edema only when traveling         Objective:   Physical Exam Appears healthy and well-nourished & in no acute distress Fundal exam reveals no vascular pathology. No carotid bruits are present.No neck pain distention present at 10 - 15 degrees. Thyroid normal to palpation. Right lobe is physiologically enlarged compared to the left. No nodules palpable  Heart rhythm and rate are normal with no gallop or murmur  Chest is clear with no increased work of breathing  There is no evidence of aortic aneurysm or renal artery bruits  Abdomen soft with no organomegaly or masses. No HJR.  Aorta palpable; no aortic aneurysm present.  No clubbing, cyanosis or edema present.  Pedal pulses are intact   No ischemic skin changes are present . Fingernails healthy   Alert and oriented. Strength, tone, DTRs reflexes normal          Assessment & Plan:  See Current Assessment & Plan in Problem List under specific Diagnosis

## 2013-10-17 NOTE — Assessment & Plan Note (Signed)
Blood pressure goals reviewed. BMET 

## 2013-10-18 ENCOUNTER — Telehealth: Payer: Self-pay | Admitting: *Deleted

## 2013-10-18 DIAGNOSIS — I10 Essential (primary) hypertension: Secondary | ICD-10-CM

## 2013-10-18 MED ORDER — LISINOPRIL 10 MG PO TABS: 10.0000 mg | ORAL_TABLET | Freq: Every day | ORAL | Status: DC

## 2013-10-18 NOTE — Telephone Encounter (Signed)
Refill done.  

## 2013-12-06 ENCOUNTER — Other Ambulatory Visit: Payer: Self-pay | Admitting: Internal Medicine

## 2013-12-06 DIAGNOSIS — Z1231 Encounter for screening mammogram for malignant neoplasm of breast: Secondary | ICD-10-CM

## 2013-12-14 ENCOUNTER — Ambulatory Visit (HOSPITAL_COMMUNITY)
Admission: RE | Admit: 2013-12-14 | Discharge: 2013-12-14 | Disposition: A | Discharge status: Home / Self Care | Payer: Self-pay | Source: Ambulatory Visit | Attending: Internal Medicine | Admitting: Internal Medicine

## 2013-12-14 DIAGNOSIS — Z1231 Encounter for screening mammogram for malignant neoplasm of breast: Secondary | ICD-10-CM

## 2014-01-22 ENCOUNTER — Encounter: Payer: Self-pay | Admitting: Internal Medicine

## 2014-01-22 ENCOUNTER — Ambulatory Visit (INDEPENDENT_AMBULATORY_CARE_PROVIDER_SITE_OTHER): Payer: Self-pay | Admitting: Internal Medicine

## 2014-01-22 VITALS — BP 138/78 | HR 66 | Temp 98.3°F | Resp 14 | Ht 64.0 in | Wt 181.4 lb

## 2014-01-22 DIAGNOSIS — I10 Essential (primary) hypertension: Secondary | ICD-10-CM

## 2014-01-22 MED ORDER — LOSARTAN POTASSIUM-HCTZ 50-12.5 MG PO TABS: 1.0000 | ORAL_TABLET | Freq: Every day | ORAL | Status: DC

## 2014-01-22 NOTE — Progress Notes (Signed)
Pre visit review using our clinic review tool, if applicable. No additional management support is needed unless otherwise documented below in the visit note. 

## 2014-01-22 NOTE — Patient Instructions (Signed)
We have sent in a new medicine for you with two medicines. Losartan and the hydrochlorothiazide (HCTZ). It is replace your two blood pressure medicines now. If it is too expensive let us know and we can send it somewhere else. It is generic and should not be too expensive.   Come back in about 6-8 months for your pap smear and to check on the blood pressure.

## 2014-01-23 NOTE — Progress Notes (Signed)
   Subjective:    Patient ID: Aimee Anderson, female    DOB: [birth year], 48 y.o.   MRN: [id number]  HPI The patient is a 48 YO woman who is coming in to establish care. She has PMH of HTN. 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 She denies any complaints at this time and no chest pains, SOB, headaches, blurred vision. She does have a cough with her blood pressure medicine and is interested in changing if a cost effective alternative is available.  Review of Systems  Constitutional: Negative for fever, activity change, appetite change and fatigue.  HENT: Negative.   Eyes: Negative.   Respiratory: Positive for cough. Negative for chest tightness, shortness of breath and wheezing.   Cardiovascular: Negative for chest pain, palpitations and leg swelling.  Gastrointestinal: Negative for abdominal pain, diarrhea, constipation and abdominal distention.  Endocrine: Negative.   Musculoskeletal: Negative.   Skin: Negative.   Neurological: Negative.   Psychiatric/Behavioral: Negative.       Objective:   Physical Exam  Vitals reviewed. Constitutional: She is oriented to person, place, and time. She appears well-developed and well-nourished.  HENT:  Head: Normocephalic and atraumatic.  Eyes: EOM are normal.  Neck: Normal range of motion.  Cardiovascular: Normal rate and regular rhythm.   Pulmonary/Chest: Effort normal and breath sounds normal. No respiratory distress. She has no wheezes. She has no rales.  Abdominal: Soft. Bowel sounds are normal. She exhibits no distension. There is no tenderness. There is no rebound.  Neurological: She is alert and oriented to person, place, and time. Coordination normal.  Skin: Skin is warm and dry.   Filed Vitals:   01/22/14 1508  BP: 138/78  Pulse: 66  Temp: 98.3 F (36.8 C)  TempSrc: Oral  Resp: 14  Height: 5\' 4"  (1.626 m)  Weight: 181 lb 6.4 oz (82.283 kg)  SpO2: 95%      Assessment & Plan:

## 2014-01-23 NOTE — Assessment & Plan Note (Signed)
BP well controlled and will have her change to losartan-hctz 50/12.5 mg daily. She just got her 3 month supply so will change when she runs out. She will return in about 6 months and check BMP at that time.

## 2014-02-25 ENCOUNTER — Encounter: Payer: Self-pay | Admitting: Internal Medicine

## 2014-11-22 ENCOUNTER — Ambulatory Visit (INDEPENDENT_AMBULATORY_CARE_PROVIDER_SITE_OTHER): Payer: 59 | Admitting: Internal Medicine

## 2014-11-22 ENCOUNTER — Encounter: Payer: Self-pay | Admitting: Internal Medicine

## 2014-11-22 ENCOUNTER — Ambulatory Visit (INDEPENDENT_AMBULATORY_CARE_PROVIDER_SITE_OTHER)
Admission: RE | Admit: 2014-11-22 | Discharge: 2014-11-22 | Disposition: A | Discharge status: Home / Self Care | Payer: 59 | Source: Ambulatory Visit | Attending: Internal Medicine | Admitting: Internal Medicine

## 2014-11-22 ENCOUNTER — Other Ambulatory Visit: Payer: 59

## 2014-11-22 VITALS — BP 148/78 | HR 60 | Temp 97.1°F | Resp 14 | Ht 64.0 in | Wt 180.8 lb

## 2014-11-22 DIAGNOSIS — M25551 Pain in right hip: Secondary | ICD-10-CM

## 2014-11-22 DIAGNOSIS — R109 Unspecified abdominal pain: Secondary | ICD-10-CM | POA: Diagnosis not present

## 2014-11-22 LAB — RHEUMATOID FACTOR: Rhuematoid fact SerPl-aCnc: <10 IU/mL (ref <=14)

## 2014-11-22 NOTE — Patient Instructions (Signed)
We will check an x-ray of the area to see if there are changes of arthritis. We will also check the blood test for rheumatoid arthritis.   If we do not get any good answers then we will look more at the stomach and the fibroids.   Come back for a physical and we can do the pap smear.

## 2014-11-22 NOTE — Assessment & Plan Note (Signed)
Checking rheumatoid factor to rule out RA, checking x-ray hip and pelvis to rule out arthritis. Most likely possibility is arthritis versus fibroids. She is perimenopausal and her symptoms worsen with cycles and she has known fibroids in the past. Advised to continue using ibuprofen prn.

## 2014-11-22 NOTE — Progress Notes (Signed)
Pre visit review using our clinic review tool, if applicable. No additional management support is needed unless otherwise documented below in the visit note. 

## 2014-11-22 NOTE — Progress Notes (Signed)
   Subjective:    Patient ID: Aimee Anderson, female    DOB: 05-Apr-1966, 49 y.o.   MRN: [id number]  HPI The patient is a 49 YO female who is coming in today for right flank pain for 2 months. Starting gradually. Worse in the morning and generally able to walk it off in <30 minutes. Has taken occasional ibuprofen which worked well. Denies any stomach problems including diarrhea, constipation, bloating, nausea, vomiting. No weight change. Her periods are irregular now and she notices the pain worse around a cycle. Family history of rheumatoid arthritis (her father). Some sclerosis of the SI joints on CT abd from 2 years ago. Sometimes the pain goes into her right thigh but does not move otherwise. No fevers or chills.   Review of Systems  Constitutional: Negative for fever, activity change, appetite change, fatigue and unexpected weight change.  Respiratory: Negative for cough, chest tightness and shortness of breath.   Cardiovascular: Negative for chest pain, palpitations and leg swelling.  Gastrointestinal: Positive for abdominal pain. Negative for nausea, diarrhea, constipation, blood in stool and abdominal distention.  Musculoskeletal: Positive for myalgias. Negative for back pain, arthralgias and gait problem.  Skin: Negative.       Objective:   Physical Exam  Constitutional: She appears well-developed and well-nourished.  HENT:  Head: Normocephalic and atraumatic.  Eyes: EOM are normal.  Neck: Normal range of motion.  Cardiovascular: Normal rate.   Pulmonary/Chest: Effort normal.  Abdominal: Soft. Bowel sounds are normal. She exhibits no distension and no mass. There is tenderness. There is no rebound and no guarding.  Tenderness at the SI joint right, no rebound or guarding in the stomach, does not radiate.    Filed Vitals:   11/22/14 0814  BP: 148/78  Pulse: 60  Temp: 97.1 F (36.2 C)  TempSrc: Oral  Resp: 14  Height: 5\' 4"  (1.626 m)  Weight: 180 lb 12.8 oz (82.01 kg)    SpO2: 99%      Assessment & Plan:

## 2015-02-07 ENCOUNTER — Other Ambulatory Visit: Payer: Self-pay | Admitting: Internal Medicine

## 2015-02-17 ENCOUNTER — Other Ambulatory Visit: Payer: Self-pay

## 2015-02-17 DIAGNOSIS — Z1231 Encounter for screening mammogram for malignant neoplasm of breast: Secondary | ICD-10-CM

## 2015-03-03 ENCOUNTER — Ambulatory Visit
Admission: RE | Admit: 2015-03-03 | Discharge: 2015-03-03 | Disposition: A | Discharge status: Home / Self Care | Payer: 59 | Source: Ambulatory Visit

## 2015-03-03 DIAGNOSIS — Z1231 Encounter for screening mammogram for malignant neoplasm of breast: Secondary | ICD-10-CM

## 2015-04-07 ENCOUNTER — Other Ambulatory Visit (INDEPENDENT_AMBULATORY_CARE_PROVIDER_SITE_OTHER): Payer: 59

## 2015-04-07 ENCOUNTER — Ambulatory Visit (INDEPENDENT_AMBULATORY_CARE_PROVIDER_SITE_OTHER): Payer: 59 | Admitting: Internal Medicine

## 2015-04-07 ENCOUNTER — Encounter: Payer: Self-pay | Admitting: Internal Medicine

## 2015-04-07 VITALS — BP 124/78 | HR 72 | Temp 98.3°F | Resp 16 | Ht 64.0 in | Wt 180.4 lb

## 2015-04-07 DIAGNOSIS — N951 Menopausal and female climacteric states: Secondary | ICD-10-CM | POA: Diagnosis not present

## 2015-04-07 DIAGNOSIS — N921 Excessive and frequent menstruation with irregular cycle: Secondary | ICD-10-CM

## 2015-04-07 LAB — LUTEINIZING HORMONE: LH: 27.48 m[IU]/mL

## 2015-04-07 LAB — CBC
HCT: 37.6 % (ref 36.0–46.0)
Hemoglobin: 12.4 g/dL (ref 12.0–15.0)
MCHC: 32.9 g/dL (ref 30.0–36.0)
MCV: 84.6 fl (ref 78.0–100.0)
Platelets: 223 10*3/uL (ref 150.0–400.0)
RBC: 4.44 Mil/uL (ref 3.87–5.11)
RDW: 15.4 % (ref 11.5–15.5)
WBC: 5.5 10*3/uL (ref 4.0–10.5)

## 2015-04-07 LAB — FOLLICLE STIMULATING HORMONE: FSH: 42 m[IU]/mL

## 2015-04-07 MED ORDER — LOSARTAN POTASSIUM-HCTZ 50-12.5 MG PO TABS: 1.0000 | ORAL_TABLET | Freq: Every day | ORAL | Status: DC

## 2015-04-07 NOTE — Patient Instructions (Signed)
We suspect that you may be starting into perimenopause (or just before menopause) with some of the symptoms. Your periods can start to become irregular. Keep the appointment with the gynecologist so they can check out the fibroids to make sure that they are not growing.   Perimenopause Perimenopause is the time when your body begins to move into the menopause (no menstrual period for 12 straight months). It is a natural process. Perimenopause can begin 2-8 years before the menopause and usually lasts for 1 year after the menopause. During this time, your ovaries may or may not produce an egg. The ovaries vary in their production of estrogen and progesterone hormones each month. This can cause irregular menstrual periods, difficulty getting pregnant, vaginal bleeding between periods, and uncomfortable symptoms. CAUSES  Irregular production of the ovarian hormones, estrogen and progesterone, and not ovulating every month.  Other causes include:  Tumor of the pituitary gland in the brain.  Medical disease that affects the ovaries.  Radiation treatment.  Chemotherapy.  Unknown causes.  Heavy smoking and excessive alcohol intake can bring on perimenopause sooner. SIGNS AND SYMPTOMS   Hot flashes.  Night sweats.  Irregular menstrual periods.  Decreased sex drive.  Vaginal dryness.  Headaches.  Mood swings.  Depression.  Memory problems.  Irritability.  Tiredness.  Weight gain.  Trouble getting pregnant.  The beginning of losing bone cells (osteoporosis).  The beginning of hardening of the arteries (atherosclerosis). DIAGNOSIS  Your health care provider will make a diagnosis by analyzing your age, menstrual history, and symptoms. He or she will do a physical exam and note any changes in your body, especially your female organs. Female hormone tests may or may not be helpful depending on the amount of female hormones you produce and when you produce them. However, other  hormone tests may be helpful to rule out other problems. TREATMENT  In some cases, no treatment is needed. The decision on whether treatment is necessary during the perimenopause should be made by you and your health care provider based on how the symptoms are affecting you and your lifestyle. Various treatments are available, such as:  Treating individual symptoms with a specific medicine for that symptom.  Herbal medicines that can help specific symptoms.  Counseling.  Group therapy. HOME CARE INSTRUCTIONS   Keep track of your menstrual periods (when they occur, how heavy they are, how long between periods, and how long they last) as well as your symptoms and when they started.  Only take over-the-counter or prescription medicines as directed by your health care provider.  Sleep and rest.  Exercise.  Eat a diet that contains calcium (good for your bones) and soy (acts like the estrogen hormone).  Do not smoke.  Avoid alcoholic beverages.  Take vitamin supplements as recommended by your health care provider. Taking vitamin E may help in certain cases.  Take calcium and vitamin D supplements to help prevent bone loss.  Group therapy is sometimes helpful.  Acupuncture may help in some cases. SEEK MEDICAL CARE IF:   You have questions about any symptoms you are having.  You need a referral to a specialist (gynecologist, psychiatrist, or psychologist). SEEK IMMEDIATE MEDICAL CARE IF:   You have vaginal bleeding.  Your period lasts longer than 8 days.  Your periods are recurring sooner than 21 days.  You have bleeding after intercourse.  You have severe depression.  You have pain when you urinate.  You have severe headaches.  You have vision problems.  This information is not intended to replace advice given to you by your health care provider. Make sure you discuss any questions you have with your health care provider.   Document Released: 05/20/2004 Document  Revised: 05/03/2014 Document Reviewed: 11/09/2012 Elsevier Interactive Patient Education [employer].

## 2015-04-07 NOTE — Progress Notes (Signed)
Pre visit review using our clinic review tool, if applicable. No additional management support is needed unless otherwise documented below in the visit note. 

## 2015-04-10 DIAGNOSIS — N951 Menopausal and female climacteric states: Secondary | ICD-10-CM | POA: Insufficient documentation

## 2015-04-10 NOTE — Assessment & Plan Note (Signed)
Suspect she is perimenopausal. Checking LH and FSH and CBC today. Talked to her about progression to menopause and things to watch out for. If still bleeding this frequently have advised her to see Ob/Gyn (could not get in until January).

## 2015-04-10 NOTE — Progress Notes (Signed)
   Subjective:    Patient ID: Aimee Anderson, female    DOB: 07/29/1965, 49 y.o.   MRN: [id number]  HPI The patient is a 49 YO female coming in for irregular periods. She has had fairly normal periods and starting in November she has had periods every other week. Denies SOB or fatigue. Not very heavy. Denies other changes. No hot flashes.   Review of Systems  Constitutional: Negative for fever, activity change, appetite change, fatigue and unexpected weight change.  Respiratory: Negative for cough, chest tightness and shortness of breath.   Cardiovascular: Negative for chest pain, palpitations and leg swelling.  Gastrointestinal: Negative for nausea, abdominal pain, diarrhea, constipation, blood in stool and abdominal distention.  Genitourinary: Positive for vaginal bleeding and menstrual problem.  Musculoskeletal: Positive for myalgias. Negative for back pain, arthralgias and gait problem.  Skin: Negative.   Neurological: Negative.   Hematological: Negative.       Objective:   Physical Exam  Constitutional: She appears well-developed and well-nourished.  HENT:  Head: Normocephalic and atraumatic.  Eyes: EOM are normal.  Neck: Normal range of motion.  Cardiovascular: Normal rate.   Pulmonary/Chest: Effort normal. No respiratory distress. She has no wheezes.  Abdominal: Soft. Bowel sounds are normal. She exhibits no distension. There is no tenderness.  Neurological: Coordination normal.  Skin: Skin is warm and dry.   Filed Vitals:   04/07/15 1537  BP: 124/78  Pulse: 72  Temp: 98.3 F (36.8 C)  TempSrc: Oral  Resp: 16  Height: 5\' 4"  (1.626 m)  Weight: 180 lb 6.4 oz (81.829 kg)  SpO2: 98%      Assessment & Plan:

## 2016-01-20 ENCOUNTER — Other Ambulatory Visit: Payer: Self-pay | Admitting: Obstetrics and Gynecology

## 2016-01-20 ENCOUNTER — Encounter (HOSPITAL_COMMUNITY): Payer: Self-pay

## 2016-01-20 ENCOUNTER — Encounter (HOSPITAL_COMMUNITY)
Admission: RE | Admit: 2016-01-20 | Discharge: 2016-01-20 | Disposition: A | Discharge status: Home / Self Care | Payer: BLUE CROSS/BLUE SHIELD | Source: Ambulatory Visit | Attending: Obstetrics and Gynecology | Admitting: Obstetrics and Gynecology

## 2016-01-20 ENCOUNTER — Other Ambulatory Visit: Payer: Self-pay

## 2016-01-20 DIAGNOSIS — N924 Excessive bleeding in the premenopausal period: Secondary | ICD-10-CM | POA: Diagnosis not present

## 2016-01-20 DIAGNOSIS — N800 Endometriosis of uterus: Secondary | ICD-10-CM | POA: Diagnosis not present

## 2016-01-20 DIAGNOSIS — Z880 Allergy status to penicillin: Secondary | ICD-10-CM | POA: Diagnosis not present

## 2016-01-20 DIAGNOSIS — I10 Essential (primary) hypertension: Secondary | ICD-10-CM | POA: Diagnosis not present

## 2016-01-20 DIAGNOSIS — Z6832 Body mass index (BMI) 32.0-32.9, adult: Secondary | ICD-10-CM | POA: Diagnosis not present

## 2016-01-20 DIAGNOSIS — E876 Hypokalemia: Secondary | ICD-10-CM | POA: Diagnosis not present

## 2016-01-20 HISTORY — DX: Anemia, unspecified: D64.9

## 2016-01-20 LAB — BASIC METABOLIC PANEL
Anion gap: 6 (ref 5–15)
BUN: 15 mg/dL (ref 6–20)
CO2: 25 mmol/L (ref 22–32)
Calcium: 9.3 mg/dL (ref 8.9–10.3)
Chloride: 107 mmol/L (ref 101–111)
Creatinine, Ser: 0.83 mg/dL (ref 0.44–1.00)
GFR calc Af Amer: >60 mL/min (ref >60)
GFR calc non Af Amer: >60 mL/min (ref >60)
Glucose, Bld: 95 mg/dL (ref 65–99)
Potassium: 3 mmol/L — ABNORMAL LOW (ref 3.5–5.1)
Sodium: 138 mmol/L (ref 135–145)

## 2016-01-20 LAB — CBC
HCT: 27.7 % — ABNORMAL LOW (ref 36.0–46.0)
Hemoglobin: 9.9 g/dL — ABNORMAL LOW (ref 12.0–15.0)
MCH: 27.8 pg (ref 26.0–34.0)
MCHC: 35.7 g/dL (ref 30.0–36.0)
MCV: 77.8 fL — ABNORMAL LOW (ref 78.0–100.0)
Platelets: 260 10*3/uL (ref 150–400)
RBC: 3.56 MIL/uL — ABNORMAL LOW (ref 3.87–5.11)
RDW: 13.9 % (ref 11.5–15.5)
WBC: 7.2 10*3/uL (ref 4.0–10.5)

## 2016-01-20 NOTE — Patient Instructions (Addendum)
Your procedure is scheduled on: Wednesday, Sept. 27, 2017  Enter through the [employer] [facility] at:  9:30 am  Pick up the phone at the desk and dial [phone].  Call this number if you have problems the morning of surgery: [phone].  Remember: Do NOT eat or drink (including water) after midnight tonight, 9/26  Take these medicines the morning of surgery with a SIP OF WATER:  losartan-hydrochlorothiazide   Do NOT wear jewelry (body piercing), metal hair clips/bobby pins, make-up, or nail polish.  Do NOT wear lotions, powders, or perfumes.  You may wear deoderant.  Do NOT shave for 48 hours prior to surgery.  Do NOT bring valuables to the hospital.   Have a responsible adult drive you home and stay with you for 24 hours after your procedure.  Home with [facility] cell [phone].

## 2016-01-20 NOTE — Pre-Procedure Instructions (Signed)
Reviewed EKG and Labs with Dr Gifford Shave.  Potassium 3.0. No orders given.  [place] for surgery.

## 2016-01-21 ENCOUNTER — Encounter (HOSPITAL_COMMUNITY)
Admission: RE | Disposition: A | Discharge status: Home / Self Care | Payer: Self-pay | Source: Ambulatory Visit | Attending: Obstetrics and Gynecology

## 2016-01-21 ENCOUNTER — Ambulatory Visit (HOSPITAL_COMMUNITY): Payer: BLUE CROSS/BLUE SHIELD | Admitting: Anesthesiology

## 2016-01-21 ENCOUNTER — Ambulatory Visit (HOSPITAL_COMMUNITY)
Admission: RE | Admit: 2016-01-21 | Discharge: 2016-01-21 | Disposition: A | Discharge status: Home / Self Care | Payer: BLUE CROSS/BLUE SHIELD | Source: Ambulatory Visit | Attending: Obstetrics and Gynecology | Admitting: Obstetrics and Gynecology

## 2016-01-21 ENCOUNTER — Encounter (HOSPITAL_COMMUNITY): Payer: Self-pay | Admitting: *Deleted

## 2016-01-21 DIAGNOSIS — N800 Endometriosis of uterus: Secondary | ICD-10-CM | POA: Insufficient documentation

## 2016-01-21 DIAGNOSIS — E876 Hypokalemia: Secondary | ICD-10-CM | POA: Insufficient documentation

## 2016-01-21 DIAGNOSIS — I10 Essential (primary) hypertension: Secondary | ICD-10-CM | POA: Insufficient documentation

## 2016-01-21 DIAGNOSIS — N924 Excessive bleeding in the premenopausal period: Secondary | ICD-10-CM | POA: Diagnosis not present

## 2016-01-21 DIAGNOSIS — Z880 Allergy status to penicillin: Secondary | ICD-10-CM | POA: Insufficient documentation

## 2016-01-21 DIAGNOSIS — Z6832 Body mass index (BMI) 32.0-32.9, adult: Secondary | ICD-10-CM | POA: Insufficient documentation

## 2016-01-21 HISTORY — PX: DILATATION & CURETTAGE/HYSTEROSCOPY WITH MYOSURE: SHX6511

## 2016-01-21 SURGERY — DILATATION & CURETTAGE/HYSTEROSCOPY WITH MYOSURE
Anesthesia: General

## 2016-01-21 MED ORDER — MIDAZOLAM HCL 2 MG/2ML IJ SOLN
INTRAMUSCULAR | Status: AC
  Filled 2016-01-21: qty 2

## 2016-01-21 MED ORDER — DEXAMETHASONE SODIUM PHOSPHATE 4 MG/ML IJ SOLN
INTRAMUSCULAR | Status: AC
  Filled 2016-01-21: qty 1

## 2016-01-21 MED ORDER — LACTATED RINGERS IV SOLN
INTRAVENOUS | Status: DC
  Administered 2016-01-21: 12:00:00 via INTRAVENOUS
  Administered 2016-01-21: 125 mL/h via INTRAVENOUS

## 2016-01-21 MED ORDER — SCOPOLAMINE 1 MG/3DAYS TD PT72
1.0000 | MEDICATED_PATCH | Freq: Once | TRANSDERMAL | Status: DC
  Administered 2016-01-21: 1.5 mg via TRANSDERMAL

## 2016-01-21 MED ORDER — MEPERIDINE HCL 25 MG/ML IJ SOLN: 6.2500 mg | INTRAMUSCULAR | Status: DC | PRN

## 2016-01-21 MED ORDER — ONDANSETRON HCL 4 MG/2ML IJ SOLN
INTRAMUSCULAR | Status: DC | PRN
Start: 2016-01-21 — End: 2016-01-21
  Administered 2016-01-21: 4 mg via INTRAVENOUS

## 2016-01-21 MED ORDER — ONDANSETRON HCL 4 MG/2ML IJ SOLN
INTRAMUSCULAR | Status: AC
  Filled 2016-01-21: qty 2

## 2016-01-21 MED ORDER — PROPOFOL 10 MG/ML IV BOLUS
INTRAVENOUS | Status: AC
  Filled 2016-01-21: qty 20

## 2016-01-21 MED ORDER — LIDOCAINE HCL (CARDIAC) 20 MG/ML IV SOLN
INTRAVENOUS | Status: AC
  Filled 2016-01-21: qty 5

## 2016-01-21 MED ORDER — SCOPOLAMINE 1 MG/3DAYS TD PT72
MEDICATED_PATCH | TRANSDERMAL | Status: AC
  Administered 2016-01-21: 1.5 mg via TRANSDERMAL
  Filled 2016-01-21: qty 1

## 2016-01-21 MED ORDER — KETOROLAC TROMETHAMINE 30 MG/ML IJ SOLN
INTRAMUSCULAR | Status: DC | PRN
  Administered 2016-01-21 (×2): 30 mg via INTRAVENOUS

## 2016-01-21 MED ORDER — FENTANYL CITRATE (PF) 100 MCG/2ML IJ SOLN
INTRAMUSCULAR | Status: AC
  Filled 2016-01-21: qty 2

## 2016-01-21 MED ORDER — POTASSIUM CHLORIDE ER 20 MEQ PO TBCR: 40.0000 meq | EXTENDED_RELEASE_TABLET | Freq: Two times a day (BID) | ORAL | 0 refills | Status: DC

## 2016-01-21 MED ORDER — IBUPROFEN 800 MG PO TABS: 800.0000 mg | ORAL_TABLET | Freq: Three times a day (TID) | ORAL | 4 refills | Status: DC | PRN

## 2016-01-21 MED ORDER — MIDAZOLAM HCL 2 MG/2ML IJ SOLN
INTRAMUSCULAR | Status: DC | PRN
  Administered 2016-01-21: 1 mg via INTRAVENOUS

## 2016-01-21 MED ORDER — PHENYLEPHRINE HCL 10 MG/ML IJ SOLN
INTRAMUSCULAR | Status: DC | PRN
  Administered 2016-01-21: 120 ug via INTRAVENOUS

## 2016-01-21 MED ORDER — KETOROLAC TROMETHAMINE 30 MG/ML IJ SOLN
INTRAMUSCULAR | Status: AC
  Filled 2016-01-21: qty 1

## 2016-01-21 MED ORDER — LIDOCAINE HCL (CARDIAC) 20 MG/ML IV SOLN
INTRAVENOUS | Status: DC | PRN
  Administered 2016-01-21: 70 mg via INTRAVENOUS
  Administered 2016-01-21: 30 mg via INTRAVENOUS

## 2016-01-21 MED ORDER — HYDROMORPHONE HCL 1 MG/ML IJ SOLN: 0.2500 mg | INTRAMUSCULAR | Status: DC | PRN

## 2016-01-21 MED ORDER — PROPOFOL 10 MG/ML IV BOLUS
INTRAVENOUS | Status: DC | PRN
  Administered 2016-01-21: 200 mg via INTRAVENOUS

## 2016-01-21 MED ORDER — SODIUM CHLORIDE 0.9 % IR SOLN
Status: DC | PRN
  Administered 2016-01-21: 3000 mL

## 2016-01-21 MED ORDER — DEXAMETHASONE SODIUM PHOSPHATE 10 MG/ML IJ SOLN
INTRAMUSCULAR | Status: DC | PRN
  Administered 2016-01-21: 4 mg via INTRAVENOUS

## 2016-01-21 MED ORDER — ONDANSETRON HCL 4 MG/2ML IJ SOLN: 4.0000 mg | Freq: Once | INTRAMUSCULAR | Status: DC | PRN

## 2016-01-21 MED ORDER — OXYCODONE HCL 5 MG PO TABS: 5.0000 mg | ORAL_TABLET | Freq: Once | ORAL | Status: DC | PRN

## 2016-01-21 MED ORDER — FENTANYL CITRATE (PF) 100 MCG/2ML IJ SOLN
INTRAMUSCULAR | Status: DC | PRN
  Administered 2016-01-21 (×2): 50 ug via INTRAVENOUS

## 2016-01-21 MED ORDER — FLUMAZENIL 0.5 MG/5ML IV SOLN
INTRAVENOUS | Status: AC
  Filled 2016-01-21: qty 5

## 2016-01-21 MED ORDER — PHENYLEPHRINE 40 MCG/ML (10ML) SYRINGE FOR IV PUSH (FOR BLOOD PRESSURE SUPPORT)
PREFILLED_SYRINGE | INTRAVENOUS | Status: AC
  Filled 2016-01-21: qty 10

## 2016-01-21 MED ORDER — SCOPOLAMINE 1 MG/3DAYS TD PT72: 1.0000 | MEDICATED_PATCH | Freq: Once | TRANSDERMAL | 12 refills | Status: AC

## 2016-01-21 MED ORDER — OXYCODONE HCL 5 MG/5ML PO SOLN: 5.0000 mg | Freq: Once | ORAL | Status: DC | PRN

## 2016-01-21 MED ORDER — FLUMAZENIL 0.5 MG/5ML IV SOLN
INTRAVENOUS | Status: DC | PRN
  Administered 2016-01-21: 0.2 mg via INTRAVENOUS

## 2016-01-21 SURGICAL SUPPLY — 20 items
CANISTER SUCT 3000ML (MISCELLANEOUS) ×2 IMPLANT
CATH ROBINSON RED A/P 16FR (CATHETERS) ×2 IMPLANT
CLOTH BEACON ORANGE TIMEOUT ST (SAFETY) ×2 IMPLANT
CONTAINER PREFILL 10% NBF 60ML (FORM) ×3 IMPLANT
DEVICE MYOSURE LITE (MISCELLANEOUS) ×1 IMPLANT
DEVICE MYOSURE REACH (MISCELLANEOUS) IMPLANT
ELECT REM PT RETURN 9FT ADLT (ELECTROSURGICAL)
ELECTRODE REM PT RTRN 9FT ADLT (ELECTROSURGICAL) ×1 IMPLANT
FILTER ARTHROSCOPY CONVERTOR (FILTER) ×2 IMPLANT
GLOVE BIOGEL PI IND STRL 7.0 (GLOVE) ×2 IMPLANT
GLOVE BIOGEL PI INDICATOR 7.0 (GLOVE) ×2
GLOVE ECLIPSE 6.5 STRL STRAW (GLOVE) ×2 IMPLANT
GOWN STRL REUS W/TWL LRG LVL3 (GOWN DISPOSABLE) ×4 IMPLANT
PACK VAGINAL MINOR WOMEN LF (CUSTOM PROCEDURE TRAY) ×2 IMPLANT
PAD OB MATERNITY 4.3X12.25 (PERSONAL CARE ITEMS) ×2 IMPLANT
SEAL ROD LENS SCOPE MYOSURE (ABLATOR) ×2 IMPLANT
TOWEL OR 17X24 6PK STRL BLUE (TOWEL DISPOSABLE) ×4 IMPLANT
TUBING AQUILEX INFLOW (TUBING) ×2 IMPLANT
TUBING AQUILEX OUTFLOW (TUBING) ×2 IMPLANT
WATER STERILE IRR 1000ML POUR (IV SOLUTION) ×2 IMPLANT

## 2016-01-21 NOTE — Anesthesia Preprocedure Evaluation (Signed)
Anesthesia Evaluation  Patient identified by MRN, date of birth, ID band Patient awake    Reviewed: Allergy & Precautions, NPO status , Patient's Chart, lab work & pertinent test results  Airway Mallampati: I  TM Distance: >3 FB Neck ROM: Full    Dental  (+) Teeth Intact, Dental Advisory Given   Pulmonary    breath sounds clear to auscultation       Cardiovascular hypertension, Pt. on medications  Rhythm:Regular Rate:Normal     Neuro/Psych    GI/Hepatic   Endo/Other  Morbid obesity  Renal/GU      Musculoskeletal   Abdominal   Peds  Hematology   Anesthesia Other Findings   Reproductive/Obstetrics                             Anesthesia Physical Anesthesia Plan  ASA: II  Anesthesia Plan: General   Post-op Pain Management:    Induction: Intravenous  Airway Management Planned: LMA  Additional Equipment:   Intra-op Plan:   Post-operative Plan: Extubation in OR  Informed Consent: I have reviewed the patients History and Physical, chart, labs and discussed the procedure including the risks, benefits and alternatives for the proposed anesthesia with the patient or authorized representative who has indicated his/her understanding and acceptance.   Dental advisory given  Plan Discussed with: CRNA, Anesthesiologist and Surgeon  Anesthesia Plan Comments: (Spoke with pt regarding her low potassium (3.0).  Encouraged her to visit with her PCP to address this.)        Anesthesia Quick Evaluation

## 2016-01-21 NOTE — Anesthesia Postprocedure Evaluation (Signed)
Anesthesia Post Note  Patient: Aimee Anderson  Procedure(s) Performed: Procedure(s) (LRB): DILATATION & CURETTAGE/HYSTEROSCOPY WITH MYOSURE (N/A)  Patient location during evaluation: PACU Anesthesia Type: General Level of consciousness: awake and alert Pain management: pain level controlled Vital Signs Assessment: post-procedure vital signs reviewed and stable Respiratory status: spontaneous breathing, nonlabored ventilation and respiratory function stable Cardiovascular status: blood pressure returned to baseline and stable Postop Assessment: no signs of nausea or vomiting Anesthetic complications: no     Last Vitals:  Vitals:   01/21/16 0946  BP: 129/79  Pulse: 66  Resp: 18  Temp: 36.7 C    Last Pain:  Vitals:   01/21/16 0946  TempSrc: Oral  PainSc: 5    Pain Goal: Patients Stated Pain Goal: 3 (01/21/16 0946)               CREWS,DAVID A

## 2016-01-21 NOTE — H&P (Signed)
Aimee Anderson is an 50 y.o. female. Widowed black female with abnormal perimenopausal bleeding despite hormonal therapy here for surgical evaluation.  Evaluation done include sonohysterogram, endometrial biopsy  Pertinent Gynecological History: Menses: perimenopause Bleeding: dysfunctional uterine bleeding Contraception: none DES exposure: denies Blood transfusions: none Sexually transmitted diseases: no past history Previous GYN Procedures: DNC  Last mammogram: normal Date: 10/16 Last pap: abnormal: LSIL hpv neg Date:04/2015 OB History: G7P2   Menstrual History: Menarche age: n/a Patient's last menstrual period was 12/12/2015 (exact date).    Past Medical History:  Diagnosis Date  . ALLERGY, HX OF 06/22/2007  . Anemia    History  . FLANK PAIN, RIGHT 04/15/2009  . HYPERTENSION 06/22/2007  . SVD (spontaneous vaginal delivery)    x 1  . Uterine fibroid     Past Surgical History:  Procedure Laterality Date  . CESAREAN SECTION     x 1  . COLONOSCOPY    . TONSILLECTOMY    . TUBAL LIGATION      Family History  Problem Relation Age of Onset  . Ulcers Father   . Gout Father   . Diabetes Father   . Heart disease Father   . Bladder Cancer Father   . Hypertension Mother     Social History:  reports that she has never smoked. She has never used smokeless tobacco. She reports that she drinks about 0.6 oz of alcohol per week . She reports that she does not use drugs.  Allergies:  Allergies  Allergen Reactions  . Kiwi Extract Anaphylaxis  . Penicillins Hives    Has patient had a PCN reaction causing immediate rash, facial/tongue/throat swelling, SOB or lightheadedness with hypotension: Yes Has patient had a PCN reaction causing severe rash involving mucus membranes or skin necrosis: Yes Has patient had a PCN reaction that required hospitalization No Has patient had a PCN reaction occurring within the last 10 years: No If all of the above answers are "NO", then may  proceed with Cephalosporin use.     Meds: megace 40mg  po bid Losartan-HCTZ( 100-12.5mg ) MVI one po qd  ROS neg except abnl vaginal bleeding  Last menstrual period 12/12/2015. Physical Exam  Results for orders placed or performed during the hospital encounter of 01/20/16 (from the past 24 hour(s))  CBC     Status: Abnormal   Collection Time: 01/20/16  2:03 PM  Result Value Ref Range   WBC 7.2 4.0 - 10.5 K/uL   RBC 3.56 (L) 3.87 - 5.11 MIL/uL   Hemoglobin 9.9 (L) 12.0 - 15.0 g/dL   HCT 27.7 (L) 36.0 - 46.0 %   MCV 77.8 (L) 78.0 - 100.0 fL   MCH 27.8 26.0 - 34.0 pg   MCHC 35.7 30.0 - 36.0 g/dL   RDW 13.9 11.5 - 15.5 %   Platelets 260 150 - 400 K/uL  Basic metabolic panel     Status: Abnormal   Collection Time: 01/20/16  2:03 PM  Result Value Ref Range   Sodium 138 135 - 145 mmol/L   Potassium 3.0 (L) 3.5 - 5.1 mmol/L   Chloride 107 101 - 111 mmol/L   CO2 25 22 - 32 mmol/L   Glucose, Bld 95 65 - 99 mg/dL   BUN 15 6 - 20 mg/dL   Creatinine, Ser 0.83 0.44 - 1.00 mg/dL   Calcium 9.3 8.9 - 10.3 mg/dL   GFR calc non Af Amer >60 >60 mL/min   GFR calc Af Amer >60 >60 mL/min   Anion  gap 6 5 - 15    No results found.  Assessment/Plan: Abnormal perimenopausal bleeding hypokalemia P) dx hysteroscopy, D&C Risk of surgery reviewed including infection, bleeding, injury to surrounding organ structures, uterine perforation and its risk, thermal injury, fluid overload and its risk. All ? answered Replete potassium with oral med  Aimee Anderson A 01/21/2016, 5:14 AM

## 2016-01-21 NOTE — Transfer of Care (Signed)
Immediate Anesthesia Transfer of Care Note  Patient: Aimee Anderson  Procedure(s) Performed: Procedure(s): DILATATION & CURETTAGE/HYSTEROSCOPY WITH MYOSURE (N/A)  Patient Location: PACU  Anesthesia Type:General  Level of Consciousness: awake, alert , oriented and patient cooperative  Airway & Oxygen Therapy: Patient Spontanous Breathing and Patient connected to nasal cannula oxygen  Post-op Assessment: Report given to RN and Post -op Vital signs reviewed and stable  Post vital signs: Reviewed and stable  Last Vitals:  Vitals:   01/21/16 0946  BP: 129/79  Pulse: 66  Resp: 18  Temp: 36.7 C    Last Pain:  Vitals:   01/21/16 0946  TempSrc: Oral  PainSc: 5       Patients Stated Pain Goal: 3 ([id number])  Complications: No apparent anesthesia complications

## 2016-01-21 NOTE — Discharge Instructions (Signed)
CALL  IF TEMP>100.4, NOTHING PER VAGINA X 1 WK, CALL IF SOAKING A MAXI  PAD EVERY HOUR OR MORE FREQUENTLY   DISCHARGE INSTRUCTIONS: HYSTEROSCOPY  The following instructions have been prepared to help you care for yourself upon your return home.  May Remove Scop patch on or before  01/24/16  May take Ibuprofen after 5:15pm today    Personal hygiene:  Use sanitary pads for vaginal drainage, not tampons.  Shower the day after your procedure.  NO tub baths, pools or Jacuzzis for 2-3 weeks.  Wipe front to back after using the bathroom.  Activity and limitations:  Do NOT drive or operate any equipment for 24 hours. The effects of anesthesia are still present and drowsiness may result.  Do NOT rest in bed all day.  Walking is encouraged.  Walk up and down stairs slowly.  You may resume your normal activity in one to two days or as indicated by your physician. Sexual activity: NO intercourse for at least 2 weeks after the procedure, or as indicated by your Doctor.  Diet: Eat a light meal as desired this evening. You may resume your usual diet tomorrow.  Return to Work: You may resume your work activities in one to two days or as indicated by Marine scientist.  What to expect after your surgery: Expect to have vaginal bleeding/discharge for 2-3 days and spotting for up to 10 days. It is not unusual to have soreness for up to 1-2 weeks. You may have a slight burning sensation when you urinate for the first day. Mild cramps may continue for a couple of days. You may have a regular period in 2-6 weeks.  Call your doctor for any of the following:  Excessive vaginal bleeding or clotting, saturating and changing one pad every hour.  Inability to urinate 6 hours after discharge from hospital.  Pain not relieved by pain medication.  Fever of 100.4 F or greater.  Unusual vaginal discharge or odor.  Return to office _________________Call for an appointment  ___________________ Patients signature: ______________________ Nurses signature ________________________  Post Anesthesia Care Unit [phone]   Post Anesthesia Home Care Instructions  Activity: Get plenty of rest for the remainder of the day. A responsible adult should stay with you for 24 hours following the procedure.  For the next 24 hours, DO NOT: -Drive a car -Paediatric nurse -Drink alcoholic beverages -Take any medication unless instructed by your physician -Make any legal decisions or sign important papers.  Meals: Start with liquid foods such as gelatin or soup. Progress to regular foods as tolerated. Avoid greasy, spicy, heavy foods. If nausea and/or vomiting occur, drink only clear liquids until the nausea and/or vomiting subsides. Call your physician if vomiting continues.  Special Instructions/Symptoms: Your throat may feel dry or sore from the anesthesia or the breathing tube placed in your throat during surgery. If this causes discomfort, gargle with warm salt water. The discomfort should disappear within 24 hours.  If you had a scopolamine patch placed behind your ear for the management of post- operative nausea and/or vomiting:  1. The medication in the patch is effective for 72 hours, after which it should be removed.  Wrap patch in a tissue and discard in the trash. Wash hands thoroughly with soap and water. 2. You may remove the patch earlier than 72 hours if you experience unpleasant side effects which may include dry mouth, dizziness or visual disturbances. 3. Avoid touching the patch. Wash your hands with soap and water after  contact with the patch.

## 2016-01-21 NOTE — Anesthesia Procedure Notes (Signed)
Procedure Name: LMA Insertion Date/Time: 01/21/2016 11:04 AM Performed by: Tobin Chad Pre-anesthesia Checklist: Patient identified, Emergency Drugs available, Suction available, Patient being monitored and Timeout performed Patient Re-evaluated:Patient Re-evaluated prior to inductionOxygen Delivery Method: Circle system utilized and Simple face mask Preoxygenation: Pre-oxygenation with 100% oxygen Intubation Type: IV induction Ventilation: Mask ventilation without difficulty LMA: LMA inserted LMA Size: 4.0 Grade View: Grade II Number of attempts: 1 Dental Injury: Teeth and Oropharynx as per pre-operative assessment

## 2016-01-21 NOTE — Brief Op Note (Signed)
01/21/2016  11:43 AM  PATIENT:  Aimee Anderson  50 y.o. female  PRE-OPERATIVE DIAGNOSIS:  Abnormal Perimenopausal Bleeding  POST-OPERATIVE DIAGNOSIS:  ABNORMAL perimenopausal bleeding  PROCEDURE:  Diagnostic hysteroscopy, dilation and curettage using myosure  SURGEON:  Surgeon(s) and Role:    * Servando Salina, MD - Primary  PHYSICIAN ASSISTANT:   ASSISTANTS: none   ANESTHESIA:   general Findings: tubal ostia seen. Post endometrial thickening EBL:  Total I/O In: 800 [I.V.:800] Out: 40 [Urine:40]  BLOOD ADMINISTERED:none  DRAINS: none   LOCAL MEDICATIONS USED:  NONE  SPECIMEN:  Source of Specimen:  [facility]  DISPOSITION OF SPECIMEN:  PATHOLOGY  COUNTS:  YES  TOURNIQUET:  * No tourniquets in log *  DICTATION: .Other Dictation: Dictation Number [id number]  PLAN OF CARE: Discharge to home after PACU  PATIENT DISPOSITION:  PACU - hemodynamically stable.   Delay start of Pharmacological VTE agent (>24hrs) due to surgical blood loss or risk of bleeding: no

## 2016-01-22 ENCOUNTER — Encounter (HOSPITAL_COMMUNITY): Payer: Self-pay | Admitting: Obstetrics and Gynecology

## 2016-01-22 NOTE — Op Note (Signed)
NAME:  QUINNIE, SALVAGE NO.:  [id number]  MEDICAL RECORD NO.:  [id number]  LOCATION:  [facility]                          FACILITY:  [facility]  PHYSICIAN:  Servando Salina, M.D.DATE OF BIRTH:  12-09-1965  DATE OF PROCEDURE:  01/21/2016 DATE OF DISCHARGE:  01/21/2016                              OPERATIVE REPORT   PREOPERATIVE DIAGNOSIS:  Abnormal perimenopausal bleeding.  PROCEDURE:  Diagnostic hysteroscopy and dilation and curettage using MyoSure.  PREOPERATIVE DIAGNOSIS:  Abnormal perimenopausal bleeding.  ANESTHESIA:  General.  SURGEON:  Servando Salina, MD.  ASSISTANT:  None.  DESCRIPTION OF PROCEDURE:  Under adequate general anesthesia, the patient was placed in a dorsal lithotomy position.  She was sterilely prepped and draped in usual fashion.  Bladder was catheterized with small amount of urine.  Examination under anesthesia revealed anteflexed uterus.  No adnexal masses could be appreciated.  Bivalve speculum was placed in the vagina.  A single-tooth tenaculum was placed on the anterior lip of the cervix.  The cervix was then serially dilated up to #23 [facility] dilator.  A MyoSure hysteroscope was introduced into the uterine cavity.  Both tubal ostia could be seen.  The posterior aspect of the uterus had a thickened endometrium.  No discrete polyp was noted. The MyoSure LITE apparatus was then inserted for the resection of the posterior thickening as well as the remaining part of the endometrium. Good sampling has been performed.  All instruments were removed.  The uterus was then curetted for scant amount of tissue.  SPECIMEN:  Labeled endometrial curetting, was sent to Pathology.  ESTIMATED BLOOD LOSS:  Less than 10 mL.  INTRAOPERATIVE FLUID:  800 mL.  COUNTS:  Sponge and instrument counts x2 was correct.  COMPLICATION:  None.  The patient tolerated the procedure well, was transferred to Recovery in stable condition.     Servando Salina,  M.D.     /MEDQ  D:  01/21/2016  T:  01/22/2016  Job:  [id number]

## 2016-03-04 ENCOUNTER — Ambulatory Visit (INDEPENDENT_AMBULATORY_CARE_PROVIDER_SITE_OTHER): Payer: BLUE CROSS/BLUE SHIELD | Admitting: Internal Medicine

## 2016-03-04 ENCOUNTER — Encounter: Payer: Self-pay | Admitting: Internal Medicine

## 2016-03-04 ENCOUNTER — Other Ambulatory Visit (INDEPENDENT_AMBULATORY_CARE_PROVIDER_SITE_OTHER): Payer: BLUE CROSS/BLUE SHIELD

## 2016-03-04 VITALS — BP 136/90 | HR 62 | Temp 97.8°F | Resp 14 | Ht 64.0 in | Wt 191.0 lb

## 2016-03-04 DIAGNOSIS — E876 Hypokalemia: Secondary | ICD-10-CM

## 2016-03-04 LAB — COMPREHENSIVE METABOLIC PANEL
ALT: 11 U/L (ref 0–35)
AST: 13 U/L (ref 0–37)
Albumin: 3.9 g/dL (ref 3.5–5.2)
Alkaline Phosphatase: 40 U/L (ref 39–117)
BUN: 11 mg/dL (ref 6–23)
CO2: 28 mEq/L (ref 19–32)
Calcium: 9.4 mg/dL (ref 8.4–10.5)
Chloride: 106 mEq/L (ref 96–112)
Creatinine, Ser: 0.85 mg/dL (ref 0.40–1.20)
GFR: 90.2 mL/min (ref >60.00)
Glucose, Bld: 91 mg/dL (ref 70–99)
Potassium: 3.7 mEq/L (ref 3.5–5.1)
Sodium: 138 mEq/L (ref 135–145)
Total Bilirubin: 0.8 mg/dL (ref 0.2–1.2)
Total Protein: 6.9 g/dL (ref 6.0–8.3)

## 2016-03-04 LAB — MAGNESIUM: Magnesium: 2.1 mg/dL (ref 1.5–2.5)

## 2016-03-04 NOTE — Progress Notes (Signed)
Pre visit review using our clinic review tool, if applicable. No additional management support is needed unless otherwise documented below in the visit note. 

## 2016-03-04 NOTE — Assessment & Plan Note (Signed)
Recheck levels. She is on hctz which could cause low K although she is on concurrent losartan which should help. Checking magnesium and K and adjust as needed.

## 2016-03-04 NOTE — Progress Notes (Signed)
   Subjective:    Patient ID: Aimee Anderson, female    DOB: [birth year], 50 y.o.   MRN: [id number]  HPI The patient is a 50 YO female coming in for follow up of abnormal labs from her gyn. This was done back in September when she was having a D&C for abnormal bleeding. They told her that her potassium was low. They gave her potassium pills for about 1 week which she took. Denies muscle cramps or pains. No change to diet or exercise before the procedure. Not dehydrated or vomiting or diarrhea.   Review of Systems  Constitutional: Negative for activity change, appetite change, fatigue, fever and unexpected weight change.  Respiratory: Negative.   Cardiovascular: Negative.   Gastrointestinal: Negative.   Genitourinary: Positive for vaginal bleeding.  Musculoskeletal: Negative.   Neurological: Negative.       Objective:   Physical Exam  Constitutional: She is oriented to person, place, and time. She appears well-developed and well-nourished.  HENT:  Head: Normocephalic and atraumatic.  Eyes: EOM are normal.  Neck: Normal range of motion.  Cardiovascular: Normal rate and regular rhythm.   Pulmonary/Chest: Effort normal and breath sounds normal. No respiratory distress. She has no wheezes. She has no rales.  Abdominal: Soft. She exhibits no distension. There is no tenderness. There is no rebound.  Musculoskeletal: She exhibits no edema.  Neurological: She is alert and oriented to person, place, and time. Coordination normal.  Skin: Skin is warm and dry.   Vitals:   03/04/16 1032  BP: 136/90  Pulse: 62  Resp: 14  Temp: 97.8 F (36.6 C)  TempSrc: Oral  SpO2: 98%  Weight: 191 lb (86.6 kg)  Height: 5\' 4"  (1.626 m)      Assessment & Plan:

## 2016-03-04 NOTE — Patient Instructions (Signed)
We are rechecking the potassium levels today and will send the results on mychart.

## 2016-03-08 ENCOUNTER — Other Ambulatory Visit: Payer: Self-pay | Admitting: Internal Medicine

## 2016-04-22 ENCOUNTER — Other Ambulatory Visit: Payer: Self-pay | Admitting: Internal Medicine

## 2016-04-22 DIAGNOSIS — Z1231 Encounter for screening mammogram for malignant neoplasm of breast: Secondary | ICD-10-CM

## 2016-05-12 ENCOUNTER — Ambulatory Visit: Payer: BLUE CROSS/BLUE SHIELD

## 2017-03-24 ENCOUNTER — Other Ambulatory Visit: Payer: Self-pay | Admitting: Internal Medicine

## 2017-03-24 DIAGNOSIS — Z1231 Encounter for screening mammogram for malignant neoplasm of breast: Secondary | ICD-10-CM

## 2017-03-30 ENCOUNTER — Ambulatory Visit
Admission: RE | Admit: 2017-03-30 | Discharge: 2017-03-30 | Disposition: A | Discharge status: Home / Self Care | Payer: BLUE CROSS/BLUE SHIELD | Source: Ambulatory Visit | Attending: Internal Medicine | Admitting: Internal Medicine

## 2017-03-30 DIAGNOSIS — Z1231 Encounter for screening mammogram for malignant neoplasm of breast: Secondary | ICD-10-CM

## 2017-03-31 ENCOUNTER — Ambulatory Visit (INDEPENDENT_AMBULATORY_CARE_PROVIDER_SITE_OTHER): Payer: BLUE CROSS/BLUE SHIELD | Admitting: Internal Medicine

## 2017-03-31 ENCOUNTER — Encounter: Payer: Self-pay | Admitting: Internal Medicine

## 2017-03-31 ENCOUNTER — Other Ambulatory Visit (INDEPENDENT_AMBULATORY_CARE_PROVIDER_SITE_OTHER): Payer: BLUE CROSS/BLUE SHIELD

## 2017-03-31 VITALS — BP 118/82 | HR 77 | Temp 97.6°F | Ht 64.0 in | Wt 196.0 lb

## 2017-03-31 DIAGNOSIS — Z0000 Encounter for general adult medical examination without abnormal findings: Secondary | ICD-10-CM

## 2017-03-31 DIAGNOSIS — I10 Essential (primary) hypertension: Secondary | ICD-10-CM | POA: Diagnosis not present

## 2017-03-31 LAB — COMPREHENSIVE METABOLIC PANEL
ALT: 13 U/L (ref 0–35)
AST: 17 U/L (ref 0–37)
Albumin: 3.9 g/dL (ref 3.5–5.2)
Alkaline Phosphatase: 55 U/L (ref 39–117)
BUN: 14 mg/dL (ref 6–23)
CO2: 30 mEq/L (ref 19–32)
Calcium: 8.7 mg/dL (ref 8.4–10.5)
Chloride: 102 mEq/L (ref 96–112)
Creatinine, Ser: 0.76 mg/dL (ref 0.40–1.20)
GFR: 102.22 mL/min (ref >60.00)
Glucose, Bld: 80 mg/dL (ref 70–99)
Potassium: 3.3 mEq/L — ABNORMAL LOW (ref 3.5–5.1)
Sodium: 138 mEq/L (ref 135–145)
Total Bilirubin: 1 mg/dL (ref 0.2–1.2)
Total Protein: 6.6 g/dL (ref 6.0–8.3)

## 2017-03-31 LAB — CBC
HCT: 39.9 % (ref 36.0–46.0)
Hemoglobin: 13.5 g/dL (ref 12.0–15.0)
MCHC: 34 g/dL (ref 30.0–36.0)
MCV: 83.4 fl (ref 78.0–100.0)
Platelets: 196 10*3/uL (ref 150.0–400.0)
RBC: 4.78 Mil/uL (ref 3.87–5.11)
RDW: 15.8 % — ABNORMAL HIGH (ref 11.5–15.5)
WBC: 6.2 10*3/uL (ref 4.0–10.5)

## 2017-03-31 LAB — HEMOGLOBIN A1C: Hgb A1c MFr Bld: 5.1 % (ref 4.6–6.5)

## 2017-03-31 LAB — LIPID PANEL
Cholesterol: 161 mg/dL (ref 0–200)
HDL: 60.8 mg/dL (ref >39.00)
LDL Cholesterol: 87 mg/dL (ref 0–99)
NonHDL: 99.7
Total CHOL/HDL Ratio: 3
Triglycerides: 66 mg/dL (ref 0.0–149.0)
VLDL: 13.2 mg/dL (ref 0.0–40.0)

## 2017-03-31 MED ORDER — LOSARTAN POTASSIUM-HCTZ 50-12.5 MG PO TABS: 1.0000 | ORAL_TABLET | Freq: Every day | ORAL | 3 refills | Status: DC

## 2017-03-31 NOTE — Progress Notes (Signed)
   Subjective:    Patient ID: Aimee Anderson, female    DOB: 05-Jun-1965, 51 y.o.   MRN: [id number]  HPI The patient is a 51 YO female coming in for physical.   PMH, [facility], social history reviewed and updated.   Review of Systems  Constitutional: Negative.   HENT: Negative.   Eyes: Negative.   Respiratory: Negative for cough, chest tightness and shortness of breath.   Cardiovascular: Negative for chest pain, palpitations and leg swelling.  Gastrointestinal: Negative for abdominal distention, abdominal pain, constipation, diarrhea, nausea and vomiting.  Musculoskeletal: Negative.   Skin: Negative.   Neurological: Negative.   Psychiatric/Behavioral: Negative.       Objective:   Physical Exam  Constitutional: She is oriented to person, place, and time. She appears well-developed and well-nourished.  HENT:  Head: Normocephalic and atraumatic.  Eyes: EOM are normal.  Neck: Normal range of motion.  Cardiovascular: Normal rate and regular rhythm.  Pulmonary/Chest: Effort normal and breath sounds normal. No respiratory distress. She has no wheezes. She has no rales.  Abdominal: Soft. Bowel sounds are normal. She exhibits no distension. There is no tenderness. There is no rebound.  Musculoskeletal: She exhibits no edema.  Neurological: She is alert and oriented to person, place, and time. Coordination normal.  Skin: Skin is warm and dry.  Psychiatric: She has a normal mood and affect.   Vitals:   03/31/17 1050  BP: 118/82  Pulse: 77  Temp: 97.6 F (36.4 C)  TempSrc: Oral  SpO2: 99%  Weight: 196 lb (88.9 kg)  Height: 5\' 4"  (1.626 m)      Assessment & Plan:

## 2017-03-31 NOTE — Assessment & Plan Note (Signed)
Colonoscopy due in Feb 2019, flu shot declined. Tetanus up to date. Added to shingles waiting list. Counseled about sun safety and mole surveillance. Given screening recommendations. Pap and mammogram up to date.

## 2017-03-31 NOTE — Assessment & Plan Note (Signed)
Checking CMP, refill losartan/hctz. BP at goal.

## 2017-03-31 NOTE — Patient Instructions (Signed)
We will get you on the shingles waiting list.   Health Maintenance, Female Adopting a healthy lifestyle and getting preventive care can go a long way to promote health and wellness. Talk with your health care provider about what schedule of regular examinations is right for you. This is a good chance for you to check in with your provider about disease prevention and staying healthy. In between checkups, there are plenty of things you can do on your own. Experts have done a lot of research about which lifestyle changes and preventive measures are most likely to keep you healthy. Ask your health care provider for more information. Weight and diet Eat a healthy diet  Be sure to include plenty of vegetables, fruits, low-fat dairy products, and lean protein.  Do not eat a lot of foods high in solid fats, added sugars, or salt.  Get regular exercise. This is one of the most important things you can do for your health. ? Most adults should exercise for at least 150 minutes each week. The exercise should increase your heart rate and make you sweat (moderate-intensity exercise). ? Most adults should also do strengthening exercises at least twice a week. This is in addition to the moderate-intensity exercise.  Maintain a healthy weight  Body mass index (BMI) is a measurement that can be used to identify possible weight problems. It estimates body fat based on height and weight. Your health care provider can help determine your BMI and help you achieve or maintain a healthy weight.  For females 16 years of age and older: ? A BMI below 18.5 is considered underweight. ? A BMI of 18.5 to 24.9 is normal. ? A BMI of 25 to 29.9 is considered overweight. ? A BMI of 30 and above is considered obese.  Watch levels of cholesterol and blood lipids  You should start having your blood tested for lipids and cholesterol at 53 years of age, then have this test every 5 years.  You may need to have your  cholesterol levels checked more often if: ? Your lipid or cholesterol levels are high. ? You are older than 53 years of age. ? You are at high risk for heart disease.  Cancer screening Lung Cancer  Lung cancer screening is recommended for adults 30-36 years old who are at high risk for lung cancer because of a history of smoking.  A yearly low-dose CT scan of the lungs is recommended for people who: ? Currently smoke. ? Have quit within the past 15 years. ? Have at least a 30-pack-year history of smoking. A pack year is smoking an average of one pack of cigarettes a day for 1 year.  Yearly screening should continue until it has been 15 years since you quit.  Yearly screening should stop if you develop a health problem that would prevent you from having lung cancer treatment.  Breast Cancer  Practice breast self-awareness. This means understanding how your breasts normally appear and feel.  It also means doing regular breast self-exams. Let your health care provider know about any changes, no matter how small.  If you are in your 20s or 30s, you should have a clinical breast exam (CBE) by a health care provider every 1-3 years as part of a regular health exam.  If you are 61 or older, have a CBE every year. Also consider having a breast X-ray (mammogram) every year.  If you have a family history of breast cancer, talk to your health care  provider about genetic screening.  If you are at high risk for breast cancer, talk to your health care provider about having an MRI and a mammogram every year.  Breast cancer gene (BRCA) assessment is recommended for women who have family members with BRCA-related cancers. BRCA-related cancers include: ? Breast. ? Ovarian. ? Tubal. ? Peritoneal cancers.  Results of the assessment will determine the need for genetic counseling and BRCA1 and BRCA2 testing.  Cervical Cancer Your health care provider may recommend that you be screened regularly  for cancer of the pelvic organs (ovaries, uterus, and vagina). This screening involves a pelvic examination, including checking for microscopic changes to the surface of your cervix (Pap test). You may be encouraged to have this screening done every 3 years, beginning at age 21.  For women ages 30-65, health care providers may recommend pelvic exams and Pap testing every 3 years, or they may recommend the Pap and pelvic exam, combined with testing for human papilloma virus (HPV), every 5 years. Some types of HPV increase your risk of cervical cancer. Testing for HPV may also be done on women of any age with unclear Pap test results.  Other health care providers may not recommend any screening for nonpregnant women who are considered low risk for pelvic cancer and who do not have symptoms. Ask your health care provider if a screening pelvic exam is right for you.  If you have had past treatment for cervical cancer or a condition that could lead to cancer, you need Pap tests and screening for cancer for at least 20 years after your treatment. If Pap tests have been discontinued, your risk factors (such as having a new sexual partner) need to be reassessed to determine if screening should resume. Some women have medical problems that increase the chance of getting cervical cancer. In these cases, your health care provider may recommend more frequent screening and Pap tests.  Colorectal Cancer  This type of cancer can be detected and often prevented.  Routine colorectal cancer screening usually begins at 53 years of age and continues through 53 years of age.  Your health care provider may recommend screening at an earlier age if you have risk factors for colon cancer.  Your health care provider may also recommend using home test kits to check for hidden blood in the stool.  A small camera at the end of a tube can be used to examine your colon directly (sigmoidoscopy or colonoscopy). This is done to  check for the earliest forms of colorectal cancer.  Routine screening usually begins at age 50.  Direct examination of the colon should be repeated every 5-10 years through 53 years of age. However, you may need to be screened more often if early forms of precancerous polyps or small growths are found.  Skin Cancer  Check your skin from head to toe regularly.  Tell your health care provider about any new moles or changes in moles, especially if there is a change in a mole's shape or color.  Also tell your health care provider if you have a mole that is larger than the size of a pencil eraser.  Always use sunscreen. Apply sunscreen liberally and repeatedly throughout the day.  Protect yourself by wearing long sleeves, pants, a wide-brimmed hat, and sunglasses whenever you are outside.  Heart disease, diabetes, and high blood pressure  High blood pressure causes heart disease and increases the risk of stroke. High blood pressure is more likely to develop in: ?   People who have blood pressure in the high end of the normal range (130-139/85-89 mm Hg). ? People who are overweight or obese. ? People who are African American.  If you are 18-39 years of age, have your blood pressure checked every 3-5 years. If you are 40 years of age or older, have your blood pressure checked every year. You should have your blood pressure measured twice-once when you are at a hospital or clinic, and once when you are not at a hospital or clinic. Record the average of the two measurements. To check your blood pressure when you are not at a hospital or clinic, you can use: ? An automated blood pressure machine at a pharmacy. ? A home blood pressure monitor.  If you are between 55 years and 79 years old, ask your health care provider if you should take aspirin to prevent strokes.  Have regular diabetes screenings. This involves taking a blood sample to check your fasting blood sugar level. ? If you are at a  normal weight and have a low risk for diabetes, have this test once every three years after 53 years of age. ? If you are overweight and have a high risk for diabetes, consider being tested at a younger age or more often. Preventing infection Hepatitis B  If you have a higher risk for hepatitis B, you should be screened for this virus. You are considered at high risk for hepatitis B if: ? You were born in a country where hepatitis B is common. Ask your health care provider which countries are considered high risk. ? Your parents were born in a high-risk country, and you have not been immunized against hepatitis B (hepatitis B vaccine). ? You have HIV or AIDS. ? You use needles to inject street drugs. ? You live with someone who has hepatitis B. ? You have had sex with someone who has hepatitis B. ? You get hemodialysis treatment. ? You take certain medicines for conditions, including cancer, organ transplantation, and autoimmune conditions.  Hepatitis C  Blood testing is recommended for: ? Everyone born from 1945 through 1965. ? Anyone with known risk factors for hepatitis C.  Sexually transmitted infections (STIs)  You should be screened for sexually transmitted infections (STIs) including gonorrhea and chlamydia if: ? You are sexually active and are younger than 53 years of age. ? You are older than 53 years of age and your health care provider tells you that you are at risk for this type of infection. ? Your sexual activity has changed since you were last screened and you are at an increased risk for chlamydia or gonorrhea. Ask your health care provider if you are at risk.  If you do not have HIV, but are at risk, it may be recommended that you take a prescription medicine daily to prevent HIV infection. This is called pre-exposure prophylaxis (PrEP). You are considered at risk if: ? You are sexually active and do not regularly use condoms or know the HIV status of your  partner(s). ? You take drugs by injection. ? You are sexually active with a partner who has HIV.  Talk with your health care provider about whether you are at high risk of being infected with HIV. If you choose to begin PrEP, you should first be tested for HIV. You should then be tested every 3 months for as long as you are taking PrEP. Pregnancy  If you are premenopausal and you may become pregnant, ask your health   care provider about preconception counseling.  If you may become pregnant, take 400 to 800 micrograms (mcg) of folic acid every day.  If you want to prevent pregnancy, talk to your health care provider about birth control (contraception). Osteoporosis and menopause  Osteoporosis is a disease in which the bones lose minerals and strength with aging. This can result in serious bone fractures. Your risk for osteoporosis can be identified using a bone density scan.  If you are 65 years of age or older, or if you are at risk for osteoporosis and fractures, ask your health care provider if you should be screened.  Ask your health care provider whether you should take a calcium or vitamin D supplement to lower your risk for osteoporosis.  Menopause may have certain physical symptoms and risks.  Hormone replacement therapy may reduce some of these symptoms and risks. Talk to your health care provider about whether hormone replacement therapy is right for you. Follow these instructions at home:  Schedule regular health, dental, and eye exams.  Stay current with your immunizations.  Do not use any tobacco products including cigarettes, chewing tobacco, or electronic cigarettes.  If you are pregnant, do not drink alcohol.  If you are breastfeeding, limit how much and how often you drink alcohol.  Limit alcohol intake to no more than 1 drink per day for nonpregnant women. One drink equals 12 ounces of beer, 5 ounces of wine, or 1 ounces of hard liquor.  Do not use street  drugs.  Do not share needles.  Ask your health care provider for help if you need support or information about quitting drugs.  Tell your health care provider if you often feel depressed.  Tell your health care provider if you have ever been abused or do not feel safe at home. This information is not intended to replace advice given to you by your health care provider. Make sure you discuss any questions you have with your health care provider. Document Released: 10/26/2010 Document Revised: 09/18/2015 Document Reviewed: 01/14/2015 Elsevier Interactive Patient Education  2018 [employer]  

## 2017-04-01 ENCOUNTER — Other Ambulatory Visit: Payer: Self-pay

## 2017-04-01 ENCOUNTER — Telehealth: Payer: Self-pay

## 2017-04-01 MED ORDER — LOSARTAN POTASSIUM-HCTZ 50-12.5 MG PO TABS: 1.0000 | ORAL_TABLET | Freq: Every day | ORAL | 3 refills | Status: DC

## 2017-04-01 NOTE — Telephone Encounter (Signed)
Received a fax from [place] [facility] stating that the Losartan/HCT is on back order and is wondering if something else can be sent in

## 2017-04-01 NOTE — Telephone Encounter (Signed)
Re-sent to [facility] no longer uses [facility]

## 2017-04-01 NOTE — Telephone Encounter (Signed)
Can another pharmacy get it? This would be much easier than switching.

## 2017-05-04 ENCOUNTER — Telehealth: Payer: Self-pay

## 2017-05-04 NOTE — Telephone Encounter (Signed)
Left message asking patient to call back to schedule nurse visit to get first shingrix vaccine---if patient calls back to schedule, please let Tamara,RN at [facility] office know so that both vaccines can be labeled---any questions, can talk with Jonelle Sidle

## 2017-05-05 ENCOUNTER — Ambulatory Visit (INDEPENDENT_AMBULATORY_CARE_PROVIDER_SITE_OTHER): Payer: BLUE CROSS/BLUE SHIELD | Admitting: *Deleted

## 2017-05-05 DIAGNOSIS — Z23 Encounter for immunization: Secondary | ICD-10-CM

## 2017-07-02 ENCOUNTER — Encounter: Payer: Self-pay | Admitting: Internal Medicine

## 2017-07-06 ENCOUNTER — Ambulatory Visit (INDEPENDENT_AMBULATORY_CARE_PROVIDER_SITE_OTHER): Payer: BLUE CROSS/BLUE SHIELD

## 2017-07-06 DIAGNOSIS — Z23 Encounter for immunization: Secondary | ICD-10-CM

## 2017-07-07 ENCOUNTER — Ambulatory Visit: Payer: BLUE CROSS/BLUE SHIELD

## 2017-11-03 ENCOUNTER — Ambulatory Visit (AMBULATORY_SURGERY_CENTER): Payer: Self-pay

## 2017-11-03 ENCOUNTER — Other Ambulatory Visit: Payer: Self-pay

## 2017-11-03 VITALS — Ht 64.0 in | Wt 185.4 lb

## 2017-11-03 DIAGNOSIS — Z800 Family history of malignant neoplasm of digestive organs: Secondary | ICD-10-CM

## 2017-11-03 MED ORDER — NA SULFATE-K SULFATE-MG SULF 17.5-3.13-1.6 GM/177ML PO SOLN: 1.0000 | Freq: Once | ORAL | 0 refills | Status: AC

## 2017-11-03 NOTE — Progress Notes (Signed)
Denies allergies to eggs or soy products. Denies complication of anesthesia or sedation. Denies use of weight loss medication. Denies use of O2.   Emmi instructions declined.    A sample of Suprep was given to the patient.  

## 2017-11-07 ENCOUNTER — Encounter: Payer: Self-pay | Admitting: Internal Medicine

## 2017-11-17 ENCOUNTER — Encounter: Payer: Self-pay | Admitting: Internal Medicine

## 2017-11-17 ENCOUNTER — Ambulatory Visit (AMBULATORY_SURGERY_CENTER): Payer: BLUE CROSS/BLUE SHIELD | Admitting: Internal Medicine

## 2017-11-17 VITALS — BP 142/84 | HR 51 | Temp 99.3°F | Resp 14 | Ht 64.0 in | Wt 196.0 lb

## 2017-11-17 DIAGNOSIS — Z1211 Encounter for screening for malignant neoplasm of colon: Secondary | ICD-10-CM

## 2017-11-17 DIAGNOSIS — Z800 Family history of malignant neoplasm of digestive organs: Secondary | ICD-10-CM

## 2017-11-17 DIAGNOSIS — K6389 Other specified diseases of intestine: Secondary | ICD-10-CM | POA: Diagnosis not present

## 2017-11-17 DIAGNOSIS — D120 Benign neoplasm of cecum: Secondary | ICD-10-CM

## 2017-11-17 MED ORDER — SODIUM CHLORIDE 0.9 % IV SOLN: 500.0000 mL | Freq: Once | INTRAVENOUS | Status: DC

## 2017-11-17 NOTE — Op Note (Signed)
[facility] Patient Name: Aimee Anderson Procedure Date: 11/17/2017 1:38 PM MRN: [id number] Endoscopist: Docia Chuck. Henrene Pastor , MD Age: 52 Referring MD:  Date of Birth: 10-Aug-1965 Gender: Female Account #: [id number] Procedure:                Colonoscopy with cold snare polypectomy x 1 Indications:              Screening in patient at increased risk: Colorectal                            cancer in brother before age 18. Multiple prior                            exams negative Medicines:                Monitored Anesthesia Care Procedure:                Pre-Anesthesia Assessment:                           - Prior to the procedure, a History and Physical                            was performed, and patient medications and                            allergies were reviewed. The patient's tolerance of                            previous anesthesia was also reviewed. The risks                            and benefits of the procedure and the sedation                            options and risks were discussed with the patient.                            All questions were answered, and informed consent                            was obtained. Prior Anticoagulants: The patient has                            taken no previous anticoagulant or antiplatelet                            agents. ASA Grade Assessment: II - A patient with                            mild systemic disease. After reviewing the risks                            and benefits, the patient was deemed in  satisfactory condition to undergo the procedure.                           After obtaining informed consent, the colonoscope                            was passed under direct vision. Throughout the                            procedure, the patient's blood pressure, pulse, and                            oxygen saturations were monitored continuously. The                            Colonoscope was  introduced through the anus and                            advanced to the the cecum, identified by                            appendiceal orifice and ileocecal valve. The                            ileocecal valve, appendiceal orifice, and rectum                            were photographed. The quality of the bowel                            preparation was excellent. The colonoscopy was                            performed without difficulty. The patient tolerated                            the procedure well. The bowel preparation used was                            SUPREP. Scope In: 1:44:01 PM Scope Out: 1:58:08 PM Scope Withdrawal Time: 0 hours 10 minutes 45 seconds  Total Procedure Duration: 0 hours 14 minutes 7 seconds  Findings:                 A 1 mm polyp was found in the cecum. The polyp was                            removed with a cold snare. Resection and retrieval                            were complete.                           Diverticula were found in the sigmoid colon.  Internal hemorrhoids were found during                            retroflexion. The hemorrhoids were small.                           The exam was otherwise without abnormality on                            direct and retroflexion views. Complications:            No immediate complications. Estimated blood loss:                            None. Estimated Blood Loss:     Estimated blood loss: none. Impression:               - One 1 mm polyp in the cecum, removed with a cold                            snare. Resected and retrieved.                           - Diverticulosis in the sigmoid colon.                           - Internal hemorrhoids.                           - The examination was otherwise normal on direct                            and retroflexion views. Recommendation:           - Repeat colonoscopy in 5 years for surveillance.                           -  Patient has a contact number available for                            emergencies. The signs and symptoms of potential                            delayed complications were discussed with the                            patient. Return to normal activities tomorrow.                            Written discharge instructions were provided to the                            patient.                           - Resume previous diet.                           -  Continue present medications.                           - Await pathology results. Docia Chuck. Henrene Pastor, MD 11/17/2017 2:06:46 PM This report has been signed electronically.

## 2017-11-17 NOTE — Progress Notes (Signed)
Called to room to assist during endoscopic procedure.  Patient ID and intended procedure confirmed with present staff. Received instructions for my participation in the procedure from the performing physician.  

## 2017-11-17 NOTE — Patient Instructions (Signed)
YOU HAD AN ENDOSCOPIC PROCEDURE TODAY AT [facility]:   Refer to the procedure report that was given to you for any specific questions about what was found during the examination.  If the procedure report does not answer your questions, please call your gastroenterologist to clarify.  If you requested that your care partner not be given the details of your procedure findings, then the procedure report has been included in a sealed envelope for you to review at your convenience later.  YOU SHOULD EXPECT: Some feelings of bloating in the abdomen. Passage of more gas than usual.  Walking can help get rid of the air that was put into your GI tract during the procedure and reduce the bloating. If you had a lower endoscopy (such as a colonoscopy or flexible sigmoidoscopy) you may notice spotting of blood in your stool or on the toilet paper. If you underwent a bowel prep for your procedure, you may not have a normal bowel movement for a few days.  Please Note:  You might notice some irritation and congestion in your nose or some drainage.  This is from the oxygen used during your procedure.  There is no need for concern and it should clear up in a day or so.  SYMPTOMS TO REPORT IMMEDIATELY:   Following lower endoscopy (colonoscopy or flexible sigmoidoscopy):  Excessive amounts of blood in the stool  Significant tenderness or worsening of abdominal pains  Swelling of the abdomen that is new, acute  Fever of 100F or higher   Following upper endoscopy (EGD)  Vomiting of blood or coffee ground material  New chest pain or pain under the shoulder blades  Painful or persistently difficult swallowing  New shortness of breath  Fever of 100F or higher  Black, tarry-looking stools  For urgent or emergent issues, a gastroenterologist can be reached at any hour by calling [phone].   DIET:  We do recommend a small meal at first, but then you may proceed to your regular diet.  Drink  plenty of fluids but you should avoid alcoholic beverages for 24 hours.  ACTIVITY:  You should plan to take it easy for the rest of today and you should NOT DRIVE or use heavy machinery until tomorrow (because of the sedation medicines used during the test).    FOLLOW UP: Our staff will call the number listed on your records the next business day following your procedure to check on you and address any questions or concerns that you may have regarding the information given to you following your procedure. If we do not reach you, we will leave a message.  However, if you are feeling well and you are not experiencing any problems, there is no need to return our call.  We will assume that you have returned to your regular daily activities without incident.  If any biopsies were taken you will be contacted by phone or by letter within the next 1-3 weeks.  Please call us at [phone] if you have not heard about the biopsies in 3 weeks.    SIGNATURES/CONFIDENTIALITY: You and/or your care partner have signed paperwork which will be entered into your electronic medical record.  These signatures attest to the fact that that the information above on your After Visit Summary has been reviewed and is understood.  Full responsibility of the confidentiality of this discharge information lies with you and/or your care-partner.  Polyp, diverticulosis and hemorrhoid information given,

## 2017-11-17 NOTE — Progress Notes (Signed)
HISTORY OF PRESENT ILLNESS:  Aimee Anderson is a 52 y.o. female   REVIEW OF SYSTEMS:  All non-GI ROS negative except for  Past Medical History:  Diagnosis Date  . Allergy   . ALLERGY, HX OF 06/22/2007  . Anemia    History  . Arthritis   . Depression   . FLANK PAIN, RIGHT 04/15/2009  . HYPERTENSION 06/22/2007  . Sickle cell anemia (HCC)    Sickle cell Trait  . Sickle cell trait ([place])   . SVD (spontaneous vaginal delivery)    x 1  . Uterine fibroid     Past Surgical History:  Procedure Laterality Date  . CESAREAN SECTION     x 1  . COLONOSCOPY    . DILATATION & CURETTAGE/HYSTEROSCOPY WITH MYOSURE N/A 01/21/2016   Procedure: DILATATION & CURETTAGE/HYSTEROSCOPY WITH MYOSURE;  Surgeon: Servando Salina, MD;  Location: [facility];  Service: Gynecology;  Laterality: N/A;  . TONSILLECTOMY    . TUBAL LIGATION      Social History Aimee Anderson  reports that she has never smoked. She has never used smokeless tobacco. She reports that she drinks about 0.6 oz of alcohol per week. She reports that she does not use drugs.  family history includes Bladder Cancer in her father; Colon cancer in her brother; Diabetes in her father; Gout in her father; Heart disease in her father; Hypertension in her mother; Ulcers in her father.  Allergies  Allergen Reactions  . Kiwi Extract Anaphylaxis  . Penicillins Hives    Has patient had a PCN reaction causing immediate rash, facial/tongue/throat swelling, SOB or lightheadedness with hypotension: Yes Has patient had a PCN reaction causing severe rash involving mucus membranes or skin necrosis: Yes Has patient had a PCN reaction that required hospitalization No Has patient had a PCN reaction occurring within the last 10 years: No If all of the above answers are "NO", then may proceed with Cephalosporin use.        PHYSICAL EXAMINATION: Vital signs: BP (!) 151/96   Pulse 60   Temp 99.3 F (37.4 C) (Other (Comment)) Comment (Src): forehead   Ht 5\' 4"  (1.626 m)   Wt 196 lb (88.9 kg)   SpO2 99%   BMI 33.64 kg/m   Constitutional: generally well-appearing, no acute distress Psychiatric: alert and oriented x3, cooperative Eyes: extraocular movements intact, anicteric, conjunctiva pink Mouth: oral pharynx moist, no lesions Neck: supple no lymphadenopathy Cardiovascular: heart regular rate and rhythm, no murmur Lungs: clear to auscultation bilaterally Abdomen: soft, nontender, nondistended, no obvious ascites, no peritoneal signs, normal bowel sounds, no organomegaly Rectal: Extremities: no lower extremity edema bilaterally Skin: no lesions on visible extremities Neuro: No focal deficits. No asterixis.    ASSESSMENT:     PLAN:

## 2017-11-17 NOTE — Progress Notes (Signed)
Alert and oriented x3, pleased with MAC, report to RN  

## 2017-11-22 ENCOUNTER — Telehealth: Payer: Self-pay

## 2017-11-22 ENCOUNTER — Telehealth: Payer: Self-pay | Admitting: *Deleted

## 2017-11-22 ENCOUNTER — Encounter: Payer: Self-pay | Admitting: Internal Medicine

## 2017-11-22 NOTE — Telephone Encounter (Signed)
No answer, left message to call back later today, B.Schwartz RN. 

## 2017-11-22 NOTE — Telephone Encounter (Signed)
  Follow up Call-  Call back number 11/17/2017  Post procedure Call Back phone  # [phone]  Permission to leave phone message Yes  Some recent data might be hidden     Patient questions:  Do you have a fever, pain , or abdominal swelling? No. Pain Score  0 *  Have you tolerated food without any problems? Yes.    Have you been able to return to your normal activities? Yes.    Do you have any questions about your discharge instructions: Diet   No. Medications  No. Follow up visit  No.  Do you have questions or concerns about your Care? Yes.    Actions: * If pain score is 4 or above: No action needed, pain <4.

## 2018-04-07 ENCOUNTER — Other Ambulatory Visit: Payer: Self-pay | Admitting: Obstetrics and Gynecology

## 2018-04-07 DIAGNOSIS — Z1231 Encounter for screening mammogram for malignant neoplasm of breast: Secondary | ICD-10-CM

## 2018-04-11 LAB — HM PAP SMEAR: HM Pap smear: NORMAL

## 2018-04-27 ENCOUNTER — Ambulatory Visit
Admission: RE | Admit: 2018-04-27 | Discharge: 2018-04-27 | Disposition: A | Discharge status: Home / Self Care | Payer: Managed Care, Other (non HMO) | Source: Ambulatory Visit | Attending: Obstetrics and Gynecology | Admitting: Obstetrics and Gynecology

## 2018-04-27 DIAGNOSIS — Z1231 Encounter for screening mammogram for malignant neoplasm of breast: Secondary | ICD-10-CM

## 2018-05-10 ENCOUNTER — Other Ambulatory Visit: Payer: Self-pay | Admitting: Internal Medicine

## 2018-05-10 NOTE — Telephone Encounter (Signed)
Copied from [place] [phone]. Topic: Quick Communication - Rx Refill/Question >> May 10, 2018  4:04 PM Oneta Rack wrote: Relation to pt: self  Call back number: [phone] Pharmacy: [facility] # [id number] [place] [phone] (Phone) [phone] (Fax)  Reason for call:   Patient requesting losartan-hydrochlorothiazide (HYZAAR) 50-12.5 MG tablet to hold her over until 05/26/2018 physical appointment.

## 2018-05-10 NOTE — Telephone Encounter (Signed)
Requested medication (s) are due for refill today: yes  Requested medication (s) are on the active medication list: yes  Last refill:  126/18 with 2 refills  Future visit scheduled: yes on 05/26/18  Notes to clinic:  Pt asking if she could receive a refill of medication until appt on 05/26/18. Last OV on 03/31/17    Requested Prescriptions  Pending Prescriptions Disp Refills   losartan-hydrochlorothiazide (HYZAAR) 50-12.5 MG tablet 90 tablet 0    Sig: Take 1 tablet by mouth daily.     Cardiovascular: ARB + Diuretic Combos Failed - 05/10/2018  4:38 PM      Failed - K in normal range and within 180 days    Potassium  Date Value Ref Range Status  03/31/2017 3.3 (L) 3.5 - 5.1 mEq/L Final         Failed - Na in normal range and within 180 days    Sodium  Date Value Ref Range Status  03/31/2017 138 135 - 145 mEq/L Final         Failed - Cr in normal range and within 180 days    Creatinine, Ser  Date Value Ref Range Status  03/31/2017 0.76 0.40 - 1.20 mg/dL Final         Failed - Ca in normal range and within 180 days    Calcium  Date Value Ref Range Status  03/31/2017 8.7 8.4 - 10.5 mg/dL Final         Failed - Last BP in normal range    BP Readings from Last 1 Encounters:  11/17/17 (!) 142/84         Failed - Valid encounter within last 6 months    Recent Outpatient Visits          1 year ago Routine general medical examination at a health care facility   [place], Elizabeth A, MD   2 years ago Low blood potassium   [place], Elizabeth A, MD   3 years ago Menorrhagia with irregular cycle   [place], Elizabeth A, MD   3 years ago Right hip pain   [place], Elizabeth A, MD   4 years ago [place], [place], MD      Future Appointments            In 2 weeks  Sharlet Salina Real Cons, MD [place], [place] - Patient is not pregnant

## 2018-05-11 MED ORDER — LOSARTAN POTASSIUM-HCTZ 50-12.5 MG PO TABS: 1.0000 | ORAL_TABLET | Freq: Every day | ORAL | 0 refills | Status: DC

## 2018-05-11 NOTE — Telephone Encounter (Signed)
Per office policy sent 30 day to local pharmacy until appt.../lmb  

## 2018-05-26 ENCOUNTER — Ambulatory Visit (INDEPENDENT_AMBULATORY_CARE_PROVIDER_SITE_OTHER): Payer: Managed Care, Other (non HMO) | Admitting: Internal Medicine

## 2018-05-26 ENCOUNTER — Encounter: Payer: Self-pay | Admitting: Internal Medicine

## 2018-05-26 ENCOUNTER — Other Ambulatory Visit (INDEPENDENT_AMBULATORY_CARE_PROVIDER_SITE_OTHER): Payer: Managed Care, Other (non HMO)

## 2018-05-26 VITALS — BP 122/88 | HR 77 | Temp 97.7°F | Ht 64.0 in | Wt 193.0 lb

## 2018-05-26 DIAGNOSIS — Z0000 Encounter for general adult medical examination without abnormal findings: Secondary | ICD-10-CM

## 2018-05-26 DIAGNOSIS — I10 Essential (primary) hypertension: Secondary | ICD-10-CM | POA: Diagnosis not present

## 2018-05-26 LAB — COMPREHENSIVE METABOLIC PANEL
Chloride: 105 mEq/L (ref 96–112)
Creatinine, Ser: 1.05 mg/dL (ref 0.40–1.20)
GFR: 65.94 mL/min (ref >60.00)
Potassium: 3.4 mEq/L — ABNORMAL LOW (ref 3.5–5.1)
Sodium: 139 mEq/L (ref 135–145)

## 2018-05-26 LAB — VITAMIN D 25 HYDROXY (VIT D DEFICIENCY, FRACTURES): VITD: 14.83 ng/mL — ABNORMAL LOW (ref 30.00–100.00)

## 2018-05-26 LAB — T4, FREE: Free T4: 0.93 ng/dL (ref 0.60–1.60)

## 2018-05-26 LAB — CBC
HCT: 40.8 % (ref 36.0–46.0)
Hemoglobin: 13.9 g/dL (ref 12.0–15.0)
MCHC: 34.1 g/dL (ref 30.0–36.0)
MCV: 85.1 fl (ref 78.0–100.0)
Platelets: 196 10*3/uL (ref 150.0–400.0)
RBC: 4.8 Mil/uL (ref 3.87–5.11)
RDW: 14.4 % (ref 11.5–15.5)
WBC: 6.1 10*3/uL (ref 4.0–10.5)

## 2018-05-26 LAB — LIPID PANEL
Cholesterol: 184 mg/dL (ref 0–200)
HDL: 66.7 mg/dL (ref >39.00)
LDL Cholesterol: 94 mg/dL (ref 0–99)
NonHDL: 117.78
Total CHOL/HDL Ratio: 3
Triglycerides: 117 mg/dL (ref 0.0–149.0)
VLDL: 23.4 mg/dL (ref 0.0–40.0)

## 2018-05-26 LAB — HEMOGLOBIN A1C: Hgb A1c MFr Bld: 5 % (ref 4.6–6.5)

## 2018-05-26 LAB — VITAMIN B12: Vitamin B-12: 435 pg/mL (ref 211–911)

## 2018-05-26 LAB — TSH: TSH: 1.74 u[IU]/mL (ref 0.35–4.50)

## 2018-05-26 MED ORDER — LOSARTAN POTASSIUM-HCTZ 50-12.5 MG PO TABS: 1.0000 | ORAL_TABLET | Freq: Every day | ORAL | 3 refills | Status: DC

## 2018-05-26 NOTE — Assessment & Plan Note (Signed)
BP at goal on losartan/hctz and checking CMP and adjust as needed.

## 2018-05-26 NOTE — Assessment & Plan Note (Signed)
Flu shot declined. Shingrix counseled. Tetanus declines. Colonoscopy up to date. Mammogram up to date, pap smear up to date with gyn and dexa up to date. Counseled about sun safety and mole surveillance. Counseled about the dangers of distracted driving. Given 10 year screening recommendations.

## 2018-05-26 NOTE — Patient Instructions (Signed)

## 2018-05-26 NOTE — Progress Notes (Signed)
   Subjective:   Patient ID: Aimee Anderson, female    DOB: 12-18-1965, 53 y.o.   MRN: [id number]  HPI The patient is a 53 YO female coming in for physical. No new concerns.   PMH, [facility], social history reviewed and updated  Review of Systems  Constitutional: Negative.   HENT: Negative.   Eyes: Negative.   Respiratory: Negative for cough, chest tightness and shortness of breath.   Cardiovascular: Negative for chest pain, palpitations and leg swelling.  Gastrointestinal: Negative for abdominal distention, abdominal pain, constipation, diarrhea, nausea and vomiting.  Musculoskeletal: Negative.   Skin: Negative.   Neurological: Negative.   Psychiatric/Behavioral: Negative.     Objective:  Physical Exam Constitutional:      Appearance: She is well-developed.  HENT:     Head: Normocephalic and atraumatic.  Neck:     Musculoskeletal: Normal range of motion.  Cardiovascular:     Rate and Rhythm: Normal rate and regular rhythm.  Pulmonary:     Effort: Pulmonary effort is normal. No respiratory distress.     Breath sounds: Normal breath sounds. No wheezing or rales.  Abdominal:     General: Bowel sounds are normal. There is no distension.     Palpations: Abdomen is soft.     Tenderness: There is no abdominal tenderness. There is no rebound.  Skin:    General: Skin is warm and dry.  Neurological:     Mental Status: She is alert and oriented to person, place, and time.     Coordination: Coordination normal.     Vitals:   05/26/18 1443  BP: 122/88  Pulse: 77  Temp: 97.7 F (36.5 C)  TempSrc: Oral  SpO2: 98%  Weight: 193 lb (87.5 kg)  Height: 5\' 4"  (1.626 m)    Assessment & Plan:

## 2018-05-31 ENCOUNTER — Encounter: Payer: Self-pay | Admitting: Internal Medicine

## 2018-05-31 NOTE — Progress Notes (Signed)
Abstracted and sent to scan  

## 2018-07-21 ENCOUNTER — Encounter: Payer: Self-pay | Admitting: Internal Medicine

## 2018-07-21 ENCOUNTER — Ambulatory Visit (INDEPENDENT_AMBULATORY_CARE_PROVIDER_SITE_OTHER): Payer: Managed Care, Other (non HMO) | Admitting: Internal Medicine

## 2018-07-21 ENCOUNTER — Telehealth: Payer: Self-pay | Admitting: Internal Medicine

## 2018-07-21 DIAGNOSIS — M940 Chondrocostal junction syndrome [Tietze]: Secondary | ICD-10-CM | POA: Diagnosis not present

## 2018-07-21 DIAGNOSIS — E559 Vitamin D deficiency, unspecified: Secondary | ICD-10-CM | POA: Diagnosis not present

## 2018-07-21 MED ORDER — VITAMIN D (ERGOCALCIFEROL) 1.25 MG (50000 UNIT) PO CAPS: 50000.0000 [IU] | ORAL_CAPSULE | ORAL | 0 refills | Status: DC

## 2018-07-21 NOTE — Assessment & Plan Note (Signed)
Rx for vitamin D 50000 units weekly for 12 weeks.

## 2018-07-21 NOTE — Telephone Encounter (Signed)
Copied from [place] [id number]. Topic: Quick Communication - See Telephone Encounter >> Jul 21, 2018 12:48 PM Blase Mess A wrote: CRM for notification. See Telephone encounter for: 07/21/18.  Patient is having a dull nagging pain under her bra on her right side. She thought she pulled a muscle.  The pain has not gone away.  The patient is interested in scheduling an e-visit but if she needs to come in she is fine with that. Please advise [phone]

## 2018-07-21 NOTE — Assessment & Plan Note (Signed)
Advised to use otc medications such as tylenol or ibuprofen for pain or ice. No indication for imaging. Recent mammogram normal. Likely muscular strain from recent exercise.

## 2018-07-21 NOTE — Progress Notes (Signed)
Virtual Visit via Video Note  I connected with Aimee Anderson on 07/21/18 at  4:00 PM EDT by a video enabled telemedicine application and verified that I am speaking with the correct person using two identifiers.   I discussed the limitations of evaluation and management by telemedicine and the availability of in person appointments. The patient expressed understanding and agreed to proceed.  History of Present Illness: The patient is a 53 y.o. YO female with visit for chest wall pain on right flank. Started about 1 week ago after she started back exercising again. Last mammogram 04/27/18 which was normal. Has been stable since onset. Denies pain on breathing or cough or recent illness. Denies current fevers/chills/cough/SOB. She does get some increase in pain with bending or twisting. Overall it is stable. Has tried nothing for it. Pain 2-3/10.  Observations/Objective: Appearance: normal, breathing appears normal, good grooming, abdomen does not appear distended, location of pain is on the right chest on the side and the back under the scapula, mental status is A and O times 3  Assessment and Plan: See problem oriented charting  Follow Up Instructions: use tylenol/ibuprofen or ice for pain, rx vitamin D 50000 units weekly for 12 weeks  I discussed the assessment and treatment plan with the patient. The patient was provided an opportunity to ask questions and all were answered. The patient agreed with the plan and demonstrated an understanding of the instructions.   The patient was advised to call back or seek an in-person evaluation if the symptoms worsen or if the condition fails to improve as anticipated.  I provided 15 minutes of non-face-to-face time during this encounter.  Hoyt Koch, MD

## 2018-07-21 NOTE — Telephone Encounter (Signed)
Virtual scheduled  °

## 2019-04-27 ENCOUNTER — Other Ambulatory Visit: Payer: Self-pay | Admitting: Internal Medicine

## 2019-05-07 ENCOUNTER — Ambulatory Visit: Payer: Managed Care, Other (non HMO) | Attending: Internal Medicine

## 2019-05-07 DIAGNOSIS — Z20822 Contact with and (suspected) exposure to covid-19: Secondary | ICD-10-CM

## 2019-05-09 LAB — NOVEL CORONAVIRUS, NAA: SARS-CoV-2, NAA: DETECTED — AB

## 2019-05-21 ENCOUNTER — Ambulatory Visit: Payer: Managed Care, Other (non HMO) | Attending: Internal Medicine

## 2019-05-21 DIAGNOSIS — Z20822 Contact with and (suspected) exposure to covid-19: Secondary | ICD-10-CM

## 2019-05-22 LAB — NOVEL CORONAVIRUS, NAA: SARS-CoV-2, NAA: NOT DETECTED

## 2019-05-25 ENCOUNTER — Ambulatory Visit: Payer: Managed Care, Other (non HMO) | Admitting: Family

## 2019-05-25 ENCOUNTER — Other Ambulatory Visit: Payer: Self-pay

## 2019-05-25 ENCOUNTER — Encounter: Payer: Self-pay | Admitting: Internal Medicine

## 2019-05-25 ENCOUNTER — Encounter: Payer: Self-pay | Admitting: Family

## 2019-05-25 VITALS — BP 126/92 | HR 71 | Temp 98.7°F | Ht 64.0 in | Wt 169.8 lb

## 2019-05-25 DIAGNOSIS — G43909 Migraine, unspecified, not intractable, without status migrainosus: Secondary | ICD-10-CM

## 2019-05-25 MED ORDER — KETOROLAC TROMETHAMINE 30 MG/ML IJ SOLN
30.0000 mg | Freq: Once | INTRAMUSCULAR | Status: AC
  Administered 2019-05-25: 30 mg via INTRAMUSCULAR

## 2019-05-25 NOTE — Telephone Encounter (Signed)
Called pt made appt w/[name] @ 9:40.Marland KitchenJohny Anderson

## 2019-05-25 NOTE — Progress Notes (Signed)
Aimee Anderson is a 54 y.o. female with the following history as recorded in EpicCare:  Patient Active Problem List   Diagnosis Date Noted  . Costochondritis 07/21/2018  . Vitamin D deficiency 07/21/2018  . Routine health maintenance 04/09/2013  . Essential hypertension 06/22/2007    Current Outpatient Medications  Medication Sig Dispense Refill  . ELDERBERRY PO Take by mouth.    . losartan-hydrochlorothiazide (HYZAAR) 50-12.5 MG tablet TAKE ONE TABLET BY MOUTH ONE TIME DAILY  90 tablet 0  . Multiple Vitamin (MULTIVITAMIN) tablet Take 1 tablet by mouth daily.    . Omega-3 1000 MG CAPS Take by mouth.    [name] OVER THE COUNTER MEDICATION Vitamin C, 3,000 mg daily.    [name] OVER THE COUNTER MEDICATION Vitamin B Complex, one tablet daily.    [name] OVER THE COUNTER MEDICATION Calcium 600 mg, Once a day.    . Vitamin D, Ergocalciferol, (DRISDOL) 1.25 MG (50000 UT) CAPS capsule Take 1 capsule (50,000 Units total) by mouth every 7 (seven) days. 12 capsule 0   No current facility-administered medications for this visit.    Allergies: Kiwi extract and Penicillins  Past Medical History:  Diagnosis Date  . Allergy   . ALLERGY, HX OF 06/22/2007  . Anemia    History  . Arthritis   . Depression   . FLANK PAIN, RIGHT 04/15/2009  . HYPERTENSION 06/22/2007  . Sickle cell anemia (HCC)    Sickle cell Trait  . Sickle cell trait ([place])   . SVD (spontaneous vaginal delivery)    x 1  . Uterine fibroid     Past Surgical History:  Procedure Laterality Date  . CESAREAN SECTION     x 1  . COLONOSCOPY    . DILATATION & CURETTAGE/HYSTEROSCOPY WITH MYOSURE N/A 01/21/2016   Procedure: DILATATION & CURETTAGE/HYSTEROSCOPY WITH MYOSURE;  Surgeon: Servando Salina, MD;  Location: [facility];  Service: Gynecology;  Laterality: N/A;  . TONSILLECTOMY    . TUBAL LIGATION      Family History  Problem Relation Age of Onset  . Ulcers Father   . Gout Father   . Diabetes Father   . Heart disease Father   . Bladder Cancer  Father   . Hypertension Mother   . Colon cancer Brother   . Breast cancer Neg Hx   . Esophageal cancer Neg Hx   . Liver cancer Neg Hx   . Pancreatic cancer Neg Hx   . Rectal cancer Neg Hx   . Stomach cancer Neg Hx     Social History   Tobacco Use  . Smoking status: Never Smoker  . Smokeless tobacco: Never Used  Substance Use Topics  . Alcohol use: Yes    Alcohol/week: 1.0 standard drinks    Types: 1 Glasses of wine per week    Subjective:  Patient presents today with concerns for 1 day history of migraine headache; started yesterday- light sensitive, nausea, localized over right side of head;  Has history of migraine headaches- had improved in the past year;     Objective:  Vitals:   05/25/19 1019  BP: (!) 126/92  Pulse: 71  Temp: 98.7 F (37.1 C)  TempSrc: Oral  SpO2: 98%  Weight: 169 lb 12.8 oz (77 kg)  Height: 5\' 4"  (1.626 m)    General: Well developed, well nourished, in no acute distress  Skin : Warm and dry.  Head: Normocephalic and atraumatic  Eyes: Sclera and conjunctiva clear; pupils round and reactive to light; extraocular  movements intact  Lungs: Respirations unlabored;  Neurologic: Alert and oriented; speech intact; face symmetrical; moves all extremities well; CNII-XII intact without focal deficit   Assessment:  1. Migraine without status migrainosus, not intractable, unspecified migraine type     Plan:  Toradol IM 30 mg given in office today; she will follow-up if her symptoms persist; Encouraged to schedule for CPE with Dr. Sharlet Salina;   This visit occurred during the SARS-CoV-2 public health emergency.  Safety protocols were in place, including screening questions prior to the visit, additional usage of staff PPE, and extensive cleaning of exam room while observing appropriate contact time as indicated for disinfecting solutions.    Return for CPE with Dr. Sharlet Salina.  No orders of the defined types were placed in this encounter.   Requested  Prescriptions    No prescriptions requested or ordered in this encounter

## 2019-06-07 ENCOUNTER — Other Ambulatory Visit: Payer: Self-pay

## 2019-06-07 ENCOUNTER — Encounter: Payer: Self-pay | Admitting: Internal Medicine

## 2019-06-07 ENCOUNTER — Ambulatory Visit (INDEPENDENT_AMBULATORY_CARE_PROVIDER_SITE_OTHER): Payer: Managed Care, Other (non HMO) | Admitting: Internal Medicine

## 2019-06-07 VITALS — BP 122/78 | HR 84 | Temp 98.0°F | Ht 64.0 in | Wt 170.0 lb

## 2019-06-07 DIAGNOSIS — Z0000 Encounter for general adult medical examination without abnormal findings: Secondary | ICD-10-CM | POA: Diagnosis not present

## 2019-06-07 DIAGNOSIS — I10 Essential (primary) hypertension: Secondary | ICD-10-CM

## 2019-06-07 LAB — COMPREHENSIVE METABOLIC PANEL
ALT: 28 U/L (ref 0–35)
AST: 20 U/L (ref 0–37)
Albumin: 3.9 g/dL (ref 3.5–5.2)
Alkaline Phosphatase: 37 U/L — ABNORMAL LOW (ref 39–117)
BUN: 24 mg/dL — ABNORMAL HIGH (ref 6–23)
CO2: 31 mEq/L (ref 19–32)
Calcium: 9.7 mg/dL (ref 8.4–10.5)
Chloride: 104 mEq/L (ref 96–112)
Creatinine, Ser: 0.78 mg/dL (ref 0.40–1.20)
GFR: 92.57 mL/min (ref >60.00)
Glucose, Bld: 76 mg/dL (ref 70–99)
Potassium: 3.8 mEq/L (ref 3.5–5.1)
Sodium: 140 mEq/L (ref 135–145)
Total Bilirubin: 0.7 mg/dL (ref 0.2–1.2)
Total Protein: 6.7 g/dL (ref 6.0–8.3)

## 2019-06-07 LAB — LIPID PANEL
Cholesterol: 140 mg/dL (ref 0–200)
HDL: 59.6 mg/dL (ref >39.00)
LDL Cholesterol: 70 mg/dL (ref 0–99)
NonHDL: 80.67
Total CHOL/HDL Ratio: 2
Triglycerides: 51 mg/dL (ref 0.0–149.0)
VLDL: 10.2 mg/dL (ref 0.0–40.0)

## 2019-06-07 LAB — CBC
HCT: 37 % (ref 36.0–46.0)
Hemoglobin: 12.6 g/dL (ref 12.0–15.0)
MCHC: 34.1 g/dL (ref 30.0–36.0)
MCV: 85.5 fl (ref 78.0–100.0)
Platelets: 191 10*3/uL (ref 150.0–400.0)
RBC: 4.33 Mil/uL (ref 3.87–5.11)
RDW: 14.6 % (ref 11.5–15.5)
WBC: 5.2 10*3/uL (ref 4.0–10.5)

## 2019-06-07 NOTE — Progress Notes (Signed)
   Subjective:   Patient ID: Aimee Anderson, female    DOB: [birth year], 54 y.o.   MRN: [id number]  HPI The patient is a 54 YO female coming in for physical.   PMH, [place], social history reviewed and updated  Review of Systems  Constitutional: Negative.   HENT: Negative.   Eyes: Negative.   Respiratory: Negative for cough, chest tightness and shortness of breath.   Cardiovascular: Negative for chest pain, palpitations and leg swelling.  Gastrointestinal: Negative for abdominal distention, abdominal pain, constipation, diarrhea, nausea and vomiting.  Musculoskeletal: Negative.   Skin: Negative.   Neurological: Negative.   Psychiatric/Behavioral: Negative.     Objective:  Physical Exam Constitutional:      Appearance: She is well-developed.  HENT:     Head: Normocephalic and atraumatic.  Cardiovascular:     Rate and Rhythm: Normal rate and regular rhythm.  Pulmonary:     Effort: Pulmonary effort is normal. No respiratory distress.     Breath sounds: Normal breath sounds. No wheezing or rales.  Abdominal:     General: Bowel sounds are normal. There is no distension.     Palpations: Abdomen is soft.     Tenderness: There is no abdominal tenderness. There is no rebound.  Musculoskeletal:     Cervical back: Normal range of motion.  Skin:    General: Skin is warm and dry.  Neurological:     Mental Status: She is alert and oriented to person, place, and time.     Coordination: Coordination normal.     Vitals:   06/07/19 0924  BP: 122/78  Pulse: 84  Temp: 98 F (36.7 C)  TempSrc: Oral  SpO2: 98%  Weight: 170 lb (77.1 kg)  Height: 5\' 4"  (1.626 m)    This visit occurred during the SARS-CoV-2 public health emergency.  Safety protocols were in place, including screening questions prior to the visit, additional usage of staff PPE, and extensive cleaning of exam room while observing appropriate contact time as indicated for disinfecting solutions.   Assessment & Plan:

## 2019-06-07 NOTE — Assessment & Plan Note (Signed)
Flu shot declines. Shingrix complete. Tetanus up to date. Colonoscopy up to date. Mammogram up to date, pap smear up to date. Counseled about sun safety and mole surveillance. Counseled about the dangers of distracted driving. Given 10 year screening recommendations.

## 2019-06-07 NOTE — Patient Instructions (Signed)
Health Maintenance, Female Adopting a healthy lifestyle and getting preventive care are important in promoting health and wellness. Ask your health care provider about:  The right schedule for you to have regular tests and exams.  Things you can do on your own to prevent diseases and keep yourself healthy. What should I know about diet, weight, and exercise? Eat a healthy diet   Eat a diet that includes plenty of vegetables, fruits, low-fat dairy products, and lean protein.  Do not eat a lot of foods that are high in solid fats, added sugars, or sodium. Maintain a healthy weight Body mass index (BMI) is used to identify weight problems. It estimates body fat based on height and weight. Your health care provider can help determine your BMI and help you achieve or maintain a healthy weight. Get regular exercise Get regular exercise. This is one of the most important things you can do for your health. Most adults should:  Exercise for at least 150 minutes each week. The exercise should increase your heart rate and make you sweat (moderate-intensity exercise).  Do strengthening exercises at least twice a week. This is in addition to the moderate-intensity exercise.  Spend less time sitting. Even light physical activity can be beneficial. Watch cholesterol and blood lipids Have your blood tested for lipids and cholesterol at 56 years of age, then have this test every 5 years. Have your cholesterol levels checked more often if:  Your lipid or cholesterol levels are high.  You are older than 56 years of age.  You are at high risk for heart disease. What should I know about cancer screening? Depending on your health history and family history, you may need to have cancer screening at various ages. This may include screening for:  Breast cancer.  Cervical cancer.  Colorectal cancer.  Skin cancer.  Lung cancer. What should I know about heart disease, diabetes, and high blood  pressure? Blood pressure and heart disease  High blood pressure causes heart disease and increases the risk of stroke. This is more likely to develop in people who have high blood pressure readings, are of African descent, or are overweight.  Have your blood pressure checked: ? Every 3-5 years if you are 18-39 years of age. ? Every year if you are 40 years old or older. Diabetes Have regular diabetes screenings. This checks your fasting blood sugar level. Have the screening done:  Once every three years after age 40 if you are at a normal weight and have a low risk for diabetes.  More often and at a younger age if you are overweight or have a high risk for diabetes. What should I know about preventing infection? Hepatitis B If you have a higher risk for hepatitis B, you should be screened for this virus. Talk with your health care provider to find out if you are at risk for hepatitis B infection. Hepatitis C Testing is recommended for:  Everyone born from 1945 through 1965.  Anyone with known risk factors for hepatitis C. Sexually transmitted infections (STIs)  Get screened for STIs, including gonorrhea and chlamydia, if: ? You are sexually active and are younger than 56 years of age. ? You are older than 56 years of age and your health care provider tells you that you are at risk for this type of infection. ? Your sexual activity has changed since you were last screened, and you are at increased risk for chlamydia or gonorrhea. Ask your health care provider if   you are at risk.  Ask your health care provider about whether you are at high risk for HIV. Your health care provider may recommend a prescription medicine to help prevent HIV infection. If you choose to take medicine to prevent HIV, you should first get tested for HIV. You should then be tested every 3 months for as long as you are taking the medicine. Pregnancy  If you are about to stop having your period (premenopausal) and  you may become pregnant, seek counseling before you get pregnant.  Take 400 to 800 micrograms (mcg) of folic acid every day if you become pregnant.  Ask for birth control (contraception) if you want to prevent pregnancy. Osteoporosis and menopause Osteoporosis is a disease in which the bones lose minerals and strength with aging. This can result in bone fractures. If you are 65 years old or older, or if you are at risk for osteoporosis and fractures, ask your health care provider if you should:  Be screened for bone loss.  Take a calcium or vitamin D supplement to lower your risk of fractures.  Be given hormone replacement therapy (HRT) to treat symptoms of menopause. Follow these instructions at home: Lifestyle  Do not use any products that contain nicotine or tobacco, such as cigarettes, e-cigarettes, and chewing tobacco. If you need help quitting, ask your health care provider.  Do not use street drugs.  Do not share needles.  Ask your health care provider for help if you need support or information about quitting drugs. Alcohol use  Do not drink alcohol if: ? Your health care provider tells you not to drink. ? You are pregnant, may be pregnant, or are planning to become pregnant.  If you drink alcohol: ? Limit how much you use to 0-1 drink a day. ? Limit intake if you are breastfeeding.  Be aware of how much alcohol is in your drink. In the [place] one drink equals one 12 oz bottle of beer (355 mL), one 5 oz glass of wine (148 mL), or one 1 oz glass of hard liquor (44 mL). General instructions  Schedule regular health, dental, and eye exams.  Stay current with your vaccines.  Tell your health care provider if: ? You often feel depressed. ? You have ever been abused or do not feel safe at home. Summary  Adopting a healthy lifestyle and getting preventive care are important in promoting health and wellness.  Follow your health care provider's instructions about healthy  diet, exercising, and getting tested or screened for diseases.  Follow your health care provider's instructions on monitoring your cholesterol and blood pressure. This information is not intended to replace advice given to you by your health care provider. Make sure you discuss any questions you have with your health care provider. Document Revised: 04/05/2018 Document Reviewed: 04/05/2018 Elsevier Patient Education  2020 [employer]  

## 2019-06-07 NOTE — Assessment & Plan Note (Signed)
BP at goal on her losartan/hctz 50/12.5 mg daily. Checking CMP and adjust as needed.

## 2019-06-26 ENCOUNTER — Ambulatory Visit: Payer: Managed Care, Other (non HMO) | Admitting: Family

## 2019-06-27 ENCOUNTER — Other Ambulatory Visit: Payer: Self-pay | Admitting: Internal Medicine

## 2019-06-27 DIAGNOSIS — Z1231 Encounter for screening mammogram for malignant neoplasm of breast: Secondary | ICD-10-CM

## 2019-06-28 ENCOUNTER — Ambulatory Visit
Admission: RE | Admit: 2019-06-28 | Discharge: 2019-06-28 | Disposition: A | Discharge status: Home / Self Care | Payer: Managed Care, Other (non HMO) | Source: Ambulatory Visit

## 2019-06-28 ENCOUNTER — Other Ambulatory Visit: Payer: Self-pay

## 2019-06-28 DIAGNOSIS — Z1231 Encounter for screening mammogram for malignant neoplasm of breast: Secondary | ICD-10-CM

## 2019-07-19 ENCOUNTER — Other Ambulatory Visit: Payer: Self-pay

## 2019-07-19 ENCOUNTER — Ambulatory Visit (INDEPENDENT_AMBULATORY_CARE_PROVIDER_SITE_OTHER): Payer: Managed Care, Other (non HMO) | Admitting: Otolaryngology

## 2019-07-19 ENCOUNTER — Encounter (INDEPENDENT_AMBULATORY_CARE_PROVIDER_SITE_OTHER): Payer: Self-pay | Admitting: Otolaryngology

## 2019-07-19 VITALS — Temp 98.1°F

## 2019-07-19 DIAGNOSIS — R221 Localized swelling, mass and lump, neck: Secondary | ICD-10-CM | POA: Diagnosis not present

## 2019-07-19 NOTE — Progress Notes (Signed)
HPI: Aimee Anderson is a 54 y.o. female who presents is referred by by her PCP for evaluation of lumps on either side of her neck that she has felt for the past few months.  They are nontender..  Past Medical History:  Diagnosis Date  . Allergy   . ALLERGY, HX OF 06/22/2007  . Anemia    History  . Arthritis   . Depression   . FLANK PAIN, RIGHT 04/15/2009  . HYPERTENSION 06/22/2007  . Sickle cell anemia (HCC)    Sickle cell Trait  . Sickle cell trait ([place])   . SVD (spontaneous vaginal delivery)    x 1  . Uterine fibroid    Past Surgical History:  Procedure Laterality Date  . CESAREAN SECTION     x 1  . COLONOSCOPY    . DILATATION & CURETTAGE/HYSTEROSCOPY WITH MYOSURE N/A 01/21/2016   Procedure: DILATATION & CURETTAGE/HYSTEROSCOPY WITH MYOSURE;  Surgeon: Servando Salina, MD;  Location: [facility];  Service: Gynecology;  Laterality: N/A;  . TONSILLECTOMY    . TUBAL LIGATION     Social History   Socioeconomic History  . Marital status: Widowed    Spouse name: Not on file  . Number of children: 2  . Years of education: Not on file  . Highest education level: Not on file  Occupational History  . Occupation: Self Employed  Tobacco Use  . Smoking status: Never Smoker  . Smokeless tobacco: Never Used  Substance and Sexual Activity  . Alcohol use: Yes    Alcohol/week: 1.0 standard drinks    Types: 1 Glasses of wine per week  . Drug use: No  . Sexual activity: Not on file  Other Topics Concern  . 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 Difficulty of Paying Living Expenses:   Food Insecurity:   . Worried About Charity fundraiser in the Last Year:   . Arboriculturist in the Last Year:   Transportation Needs:   . Film/video editor (Medical):   [name] Lack of Transportation (Non-Medical):    Physical Activity:   . Days of Exercise per Week:   . Minutes of Exercise per Session:   Stress:   . Feeling of Stress :   Social Connections:   . Frequency of Communication with Friends and Family:   . Frequency of Social Gatherings with Friends and Family:   . Attends Religious Services:   . Active Member of Clubs or Organizations:   . Attends Archivist Meetings:   [name] Marital Status:    Family History  Problem Relation Age of Onset  . Ulcers Father   . Gout Father   . Diabetes Father   . Heart disease Father   . Bladder Cancer Father   . Hypertension Mother   . Colon cancer Brother   . Breast cancer Neg Hx   . Esophageal cancer Neg Hx   . Liver cancer Neg Hx   . Pancreatic cancer Neg Hx   . Rectal cancer Neg Hx   . Stomach cancer Neg Hx    Allergies  Allergen Reactions  . Kiwi Extract Anaphylaxis  . Penicillins Hives    Has patient had a PCN reaction causing immediate rash, facial/tongue/throat swelling, SOB or lightheadedness with hypotension: Yes Has patient had a PCN reaction  causing severe rash involving mucus membranes or skin necrosis: Yes Has patient had a PCN reaction that required hospitalization No Has patient had a PCN reaction occurring within the last 10 years: No If all of the above answers are "NO", then may proceed with Cephalosporin use.    Prior to Admission medications   Medication Sig Start Date End Date Taking? Authorizing Provider  ELDERBERRY PO Take by mouth.   Yes [provider]  losartan-hydrochlorothiazide (HYZAAR) 50-12.5 MG tablet TAKE ONE TABLET BY MOUTH ONE TIME DAILY  04/30/19  Yes Hoyt Koch, MD  Multiple Vitamin (MULTIVITAMIN) tablet Take 1 tablet by mouth daily.   Yes [provider]  Omega-3 1000 MG CAPS Take by mouth.   Yes [provider]  OVER THE COUNTER MEDICATION Vitamin C, 3,000 mg daily.   Yes [provider]  OVER THE COUNTER MEDICATION Vitamin B Complex, one tablet  daily.   Yes [provider]  OVER THE COUNTER MEDICATION Calcium 600 mg, Once a day.   Yes [provider]  Vitamin D, Ergocalciferol, (DRISDOL) 1.25 MG (50000 UT) CAPS capsule Take 1 capsule (50,000 Units total) by mouth every 7 (seven) days. 07/21/18  Yes Hoyt Koch, MD     Positive ROS: Otherwise negative  All other systems have been reviewed and were otherwise negative with the exception of those mentioned in the HPI and as above.  Physical Exam: Constitutional: Alert, well-appearing, no acute distress Ears: External ears without lesions or tenderness. Ear canals are clear bilaterally with intact, clear TMs.  Nasal: External nose without lesions. Septum midline. Clear nasal passages Oral: Lips and gums without lesions. Tongue and palate mucosa without lesions. Posterior oropharynx clear. Neck: No palpable adenopathy or masses.  Patient demonstrated the lumps that she feels on either side of her neck.  On palpation of the lumps they represent the lateral process of the hyoid bone and are benign.  There is no significant lymphadenopathy noted in the neck. Respiratory: Breathing comfortably  Skin: No facial/neck lesions or rash noted.  Procedures  Assessment: Bilateral neck lumps represent the lateral process of the hyoid bone  Plan: Reassured patient of normal examination otherwise. She will follow-up as needed   Radene Journey, MD   CC:

## 2019-07-31 ENCOUNTER — Encounter: Payer: Self-pay | Admitting: Internal Medicine

## 2019-07-31 ENCOUNTER — Other Ambulatory Visit: Payer: Self-pay | Admitting: Internal Medicine

## 2019-08-01 MED ORDER — LOSARTAN POTASSIUM-HCTZ 50-12.5 MG PO TABS: 1.0000 | ORAL_TABLET | Freq: Every day | ORAL | 3 refills | Status: DC

## 2020-06-23 ENCOUNTER — Other Ambulatory Visit: Payer: Self-pay

## 2020-06-23 ENCOUNTER — Emergency Department (HOSPITAL_COMMUNITY): Payer: Managed Care, Other (non HMO)

## 2020-06-23 ENCOUNTER — Emergency Department (HOSPITAL_COMMUNITY)
Admission: EM | Admit: 2020-06-23 | Discharge: 2020-06-24 | Disposition: A | Discharge status: Home / Self Care | Payer: Managed Care, Other (non HMO) | Attending: Emergency Medicine | Admitting: Emergency Medicine

## 2020-06-23 ENCOUNTER — Encounter (HOSPITAL_COMMUNITY): Payer: Self-pay | Admitting: *Deleted

## 2020-06-23 DIAGNOSIS — X005XXA Jump from burning building or structure in uncontrolled fire, initial encounter: Secondary | ICD-10-CM | POA: Diagnosis not present

## 2020-06-23 DIAGNOSIS — I10 Essential (primary) hypertension: Secondary | ICD-10-CM | POA: Diagnosis not present

## 2020-06-23 DIAGNOSIS — T1490XA Injury, unspecified, initial encounter: Secondary | ICD-10-CM

## 2020-06-23 DIAGNOSIS — W19XXXA Unspecified fall, initial encounter: Secondary | ICD-10-CM

## 2020-06-23 DIAGNOSIS — S32030A Wedge compression fracture of third lumbar vertebra, initial encounter for closed fracture: Secondary | ICD-10-CM | POA: Insufficient documentation

## 2020-06-23 DIAGNOSIS — S060X9A Concussion with loss of consciousness of unspecified duration, initial encounter: Secondary | ICD-10-CM | POA: Insufficient documentation

## 2020-06-23 DIAGNOSIS — S299XXA Unspecified injury of thorax, initial encounter: Secondary | ICD-10-CM | POA: Diagnosis not present

## 2020-06-23 DIAGNOSIS — S32020A Wedge compression fracture of second lumbar vertebra, initial encounter for closed fracture: Secondary | ICD-10-CM | POA: Insufficient documentation

## 2020-06-23 DIAGNOSIS — S32010A Wedge compression fracture of first lumbar vertebra, initial encounter for closed fracture: Secondary | ICD-10-CM | POA: Diagnosis not present

## 2020-06-23 DIAGNOSIS — R52 Pain, unspecified: Secondary | ICD-10-CM

## 2020-06-23 DIAGNOSIS — Z20822 Contact with and (suspected) exposure to covid-19: Secondary | ICD-10-CM | POA: Diagnosis not present

## 2020-06-23 DIAGNOSIS — S3991XA Unspecified injury of abdomen, initial encounter: Secondary | ICD-10-CM | POA: Diagnosis not present

## 2020-06-23 DIAGNOSIS — S060X0A Concussion without loss of consciousness, initial encounter: Secondary | ICD-10-CM

## 2020-06-23 DIAGNOSIS — S3992XA Unspecified injury of lower back, initial encounter: Secondary | ICD-10-CM | POA: Diagnosis present

## 2020-06-23 DIAGNOSIS — S298XXA Other specified injuries of thorax, initial encounter: Secondary | ICD-10-CM

## 2020-06-23 HISTORY — DX: Essential (primary) hypertension: I10
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
## 2020-06-24 ENCOUNTER — Emergency Department (HOSPITAL_COMMUNITY): Payer: Managed Care, Other (non HMO)

## 2020-06-24 ENCOUNTER — Encounter (INDEPENDENT_AMBULATORY_CARE_PROVIDER_SITE_OTHER): Payer: Self-pay | Admitting: Otolaryngology

## 2020-06-24 ENCOUNTER — Other Ambulatory Visit (HOSPITAL_COMMUNITY): Payer: Self-pay

## 2020-06-24 LAB — CBC
HCT: 36 % (ref 36.0–46.0)
Hemoglobin: 12.9 g/dL (ref 12.0–15.0)
MCH: 30.3 pg (ref 26.0–34.0)
MCHC: 35.8 g/dL (ref 30.0–36.0)
MCV: 84.5 fL (ref 80.0–100.0)
Platelets: 181 10*3/uL (ref 150–400)
RBC: 4.26 MIL/uL (ref 3.87–5.11)
RDW: 13.8 % (ref 11.5–15.5)
WBC: 8.3 10*3/uL (ref 4.0–10.5)
nRBC: 0 % (ref 0.0–0.2)

## 2020-06-24 LAB — COMPREHENSIVE METABOLIC PANEL
ALT: 29 U/L (ref 0–44)
AST: 35 U/L (ref 15–41)
Albumin: 3.6 g/dL (ref 3.5–5.0)
Alkaline Phosphatase: 60 U/L (ref 38–126)
Anion gap: 11 (ref 5–15)
BUN: 21 mg/dL — ABNORMAL HIGH (ref 6–20)
CO2: 24 mmol/L (ref 22–32)
Calcium: 9.4 mg/dL (ref 8.9–10.3)
Chloride: 103 mmol/L (ref 98–111)
Creatinine, Ser: 1.16 mg/dL — ABNORMAL HIGH (ref 0.44–1.00)
GFR, Estimated: 55 mL/min — ABNORMAL LOW (ref >60)
Glucose, Bld: 108 mg/dL — ABNORMAL HIGH (ref 70–99)
Potassium: 3.3 mmol/L — ABNORMAL LOW (ref 3.5–5.1)
Sodium: 138 mmol/L (ref 135–145)
Total Bilirubin: 0.9 mg/dL (ref 0.3–1.2)
Total Protein: 6.3 g/dL — ABNORMAL LOW (ref 6.5–8.1)

## 2020-06-24 LAB — I-STAT CHEM 8, ED
BUN: 24 mg/dL — ABNORMAL HIGH (ref 6–20)
Calcium, Ion: 1.08 mmol/L — ABNORMAL LOW (ref 1.15–1.40)
Chloride: 104 mmol/L (ref 98–111)
Creatinine, Ser: 1.2 mg/dL — ABNORMAL HIGH (ref 0.44–1.00)
Glucose, Bld: 107 mg/dL — ABNORMAL HIGH (ref 70–99)
HCT: 37 % (ref 36.0–46.0)
Hemoglobin: 12.6 g/dL (ref 12.0–15.0)
Potassium: 3.2 mmol/L — ABNORMAL LOW (ref 3.5–5.1)
Sodium: 139 mmol/L (ref 135–145)
TCO2: 25 mmol/L (ref 22–32)

## 2020-06-24 LAB — PROTIME-INR
INR: 1 (ref 0.8–1.2)
Prothrombin Time: 13.1 seconds (ref 11.4–15.2)

## 2020-06-24 LAB — RESP PANEL BY RT-PCR (FLU A&B, COVID) ARPGX2
Influenza A by PCR: NEGATIVE
Influenza B by PCR: NEGATIVE
SARS Coronavirus 2 by RT PCR: NEGATIVE

## 2020-06-24 LAB — SAMPLE TO BLOOD BANK

## 2020-06-24 LAB — ETHANOL: Alcohol, Ethyl (B): <10 mg/dL (ref <10)

## 2020-06-24 MED ORDER — KETOROLAC TROMETHAMINE 30 MG/ML IJ SOLN
30.0000 mg | Freq: Once | INTRAMUSCULAR | Status: AC
  Administered 2020-06-24: 30 mg via INTRAVENOUS
  Filled 2020-06-24: qty 1

## 2020-06-24 MED ORDER — FENTANYL CITRATE (PF) 100 MCG/2ML IJ SOLN
INTRAMUSCULAR | Status: AC
  Filled 2020-06-24: qty 2

## 2020-06-24 MED ORDER — FENTANYL CITRATE (PF) 100 MCG/2ML IJ SOLN
100.0000 ug | Freq: Once | INTRAMUSCULAR | Status: AC
  Administered 2020-06-24: 100 ug via INTRAVENOUS

## 2020-06-24 MED ORDER — HYDROCODONE-ACETAMINOPHEN 5-325 MG PO TABS: 1.0000 | ORAL_TABLET | Freq: Four times a day (QID) | ORAL | 0 refills | Status: DC | PRN

## 2020-06-24 MED ORDER — IOHEXOL 300 MG/ML  SOLN
100.0000 mL | Freq: Once | INTRAMUSCULAR | Status: AC | PRN
  Administered 2020-06-24: 100 mL via INTRAVENOUS

## 2020-06-24 NOTE — Progress Notes (Signed)
Orthopedic Tech Progress Note Patient Details:  Aimee Anderson Jan 30, 1966 [id number]  Patient ID: [name], female   DOB: [birth year], 55 y.o.   MRN: [id number] [facility]  Karolee Stamps 06/24/2020, 4:07 AM

## 2020-06-24 NOTE — ED Notes (Signed)
Pt ambulatory to restroom, c/o increased pain to L thigh on ambulation. Pt assisted to wheelchair for comfort.
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
## 2020-06-25 ENCOUNTER — Telehealth: Payer: Self-pay

## 2020-06-25 NOTE — Telephone Encounter (Signed)
Transition Care Management Unsuccessful Follow-up Telephone Call  Date of discharge and from where:  06/24/2020 from [facility]  Attempts:  1st Attempt  Reason for unsuccessful TCM follow-up call:  Voice mail full

## 2020-06-26 NOTE — Telephone Encounter (Signed)
Transition Care Management Follow-up Telephone Call  Date of discharge and from where: 06/23/2020 from [facility]  How have you been since you were released from the hospital? Pt stated that she is a lot of pain right but better than she was when she went to the hospital.   Any questions or concerns? No  Items Reviewed:  Did the pt receive and understand the discharge instructions provided? Yes   Medications obtained and verified? Yes   Other? No   Any new allergies since your discharge? No   Dietary orders reviewed? n/a  Do you have support at home? Yes   Functional Questionnaire: (I = Independent and D = Dependent) ADLs: I  Bathing/Dressing- I  Meal Prep- I  Eating- I  Maintaining continence- I  Transferring/Ambulation- I  Managing Meds- I   Follow up appointments reviewed:   PCP Hospital f/u appt confirmed? No    Specialist Hospital f/u appt confirmed? No  Pt had not called yet. I have encouraged patient to call the neuro office to schedule an appt. Pt agreed and will call today.   Are transportation arrangements needed? No   If their condition worsens, is the pt aware to call PCP or go to the Emergency Dept.? Yes  Was the patient provided with contact information for the PCP's office or ED? Yes  Was to pt encouraged to call back with questions or concerns? Yes

## 2020-07-20 ENCOUNTER — Telehealth: Payer: Self-pay | Admitting: Internal Medicine

## 2020-07-25 NOTE — Telephone Encounter (Signed)
Patient requesting short supply of Losartan until 4/7 appointment

## 2020-07-25 NOTE — Telephone Encounter (Signed)
Medication was refilled on 07/22/2020

## 2020-07-31 ENCOUNTER — Encounter: Payer: Self-pay | Admitting: Internal Medicine

## 2020-07-31 ENCOUNTER — Other Ambulatory Visit: Payer: Self-pay

## 2020-07-31 ENCOUNTER — Ambulatory Visit (INDEPENDENT_AMBULATORY_CARE_PROVIDER_SITE_OTHER): Payer: Managed Care, Other (non HMO) | Admitting: Internal Medicine

## 2020-07-31 VITALS — BP 118/80 | HR 76 | Temp 98.1°F | Resp 18 | Ht 62.0 in | Wt 165.0 lb

## 2020-07-31 DIAGNOSIS — I10 Essential (primary) hypertension: Secondary | ICD-10-CM

## 2020-07-31 DIAGNOSIS — Z0000 Encounter for general adult medical examination without abnormal findings: Secondary | ICD-10-CM

## 2020-07-31 LAB — COMPREHENSIVE METABOLIC PANEL
ALT: 13 U/L (ref 0–35)
AST: 14 U/L (ref 0–37)
Albumin: 4.1 g/dL (ref 3.5–5.2)
Alkaline Phosphatase: 53 U/L (ref 39–117)
BUN: 16 mg/dL (ref 6–23)
CO2: 33 mEq/L — ABNORMAL HIGH (ref 19–32)
Calcium: 9.5 mg/dL (ref 8.4–10.5)
Chloride: 106 mEq/L (ref 96–112)
Creatinine, Ser: 0.99 mg/dL (ref 0.40–1.20)
GFR: 63.63 mL/min (ref >60.00)
Glucose, Bld: 71 mg/dL (ref 70–99)
Potassium: 3.7 mEq/L (ref 3.5–5.1)
Sodium: 143 mEq/L (ref 135–145)
Total Bilirubin: 1.1 mg/dL (ref 0.2–1.2)
Total Protein: 6.9 g/dL (ref 6.0–8.3)

## 2020-07-31 LAB — LIPID PANEL
Cholesterol: 177 mg/dL (ref 0–200)
HDL: 76.1 mg/dL (ref >39.00)
LDL Cholesterol: 89 mg/dL (ref 0–99)
NonHDL: 100.48
Total CHOL/HDL Ratio: 2
Triglycerides: 56 mg/dL (ref 0.0–149.0)
VLDL: 11.2 mg/dL (ref 0.0–40.0)

## 2020-07-31 LAB — MAGNESIUM: Magnesium: 2 mg/dL (ref 1.5–2.5)
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
## 2020-07-31 NOTE — Patient Instructions (Addendum)
[email or web address]  Health Maintenance, Female Adopting a healthy lifestyle and getting preventive care are important in promoting health and wellness. Ask your health care provider about:  The right schedule for you to have regular tests and exams.  Things you can do on your own to prevent diseases and keep yourself healthy. What should I know about diet, weight, and exercise? Eat a healthy diet  Eat a diet that includes plenty of vegetables, fruits, low-fat dairy products, and lean protein.  Do not eat a lot of foods that are high in solid fats, added sugars, or sodium.   Maintain a healthy weight Body mass index (BMI) is used to identify weight problems. It estimates body fat based on height and weight. Your health care provider can help determine your BMI and help you achieve or maintain a healthy weight. Get regular exercise Get regular exercise. This is one of the most important things you can do for your health. Most adults should:  Exercise for at least 150 minutes each week. The exercise should increase your heart rate and make you sweat (moderate-intensity exercise).  Do strengthening exercises at least twice a week. This is in addition to the moderate-intensity exercise.  Spend less time sitting. Even light physical activity can be beneficial. Watch cholesterol and blood lipids Have your blood tested for lipids and cholesterol at 57 years of age, then have this test every 5 years. Have your cholesterol levels checked more often if:  Your lipid or cholesterol levels are high.  You are older than 57 years of age.  You are at high risk for heart disease. What should I know about cancer screening? Depending on your health history and family history, you may need to have cancer screening at various ages. This may include screening for:  Breast cancer.  Cervical cancer.  Colorectal cancer.  Skin cancer.  Lung cancer. What should  I know about heart disease, diabetes, and high blood pressure? Blood pressure and heart disease  High blood pressure causes heart disease and increases the risk of stroke. This is more likely to develop in people who have high blood pressure readings, are of African descent, or are overweight.  Have your blood pressure checked: ? Every 3-5 years if you are 44-56 years of age. ? Every year if you are 81 years old or older. Diabetes Have regular diabetes screenings. This checks your fasting blood sugar level. Have the screening done:  Once every three years after age 16 if you are at a normal weight and have a low risk for diabetes.  More often and at a younger age if you are overweight or have a high risk for diabetes. What should I know about preventing infection? Hepatitis B If you have a higher risk for hepatitis B, you should be screened for this virus. Talk with your health care provider to find out if you are at risk for hepatitis B infection. Hepatitis C Testing is recommended for:  Everyone born from [birth year] through 1967.  Anyone with known risk factors for hepatitis C. Sexually transmitted infections (STIs)  Get screened for STIs, including gonorrhea and chlamydia, if: ? You are sexually active and are younger than 57 years of age. ? You are older than 57 years of age and your health care provider tells you that you are at risk for this type of infection. ? Your sexual activity has changed since you were last screened, and you are at increased risk for chlamydia or gonorrhea. Ask your health  care provider if you are at risk.  Ask your health care provider about whether you are at high risk for HIV. Your health care provider may recommend a prescription medicine to help prevent HIV infection. If you choose to take medicine to prevent HIV, you should first get tested for HIV. You should then be tested every 3 months for as long as you are taking the medicine. Pregnancy  If you are  about to stop having your period (premenopausal) and you may become pregnant, seek counseling before you get pregnant.  Take 400 to 800 micrograms (mcg) of folic acid every day if you become pregnant.  Ask for birth control (contraception) if you want to prevent pregnancy. Osteoporosis and menopause Osteoporosis is a disease in which the bones lose minerals and strength with aging. This can result in bone fractures. If you are 59 years old or older, or if you are at risk for osteoporosis and fractures, ask your health care provider if you should:  Be screened for bone loss.  Take a calcium or vitamin D supplement to lower your risk of fractures.  Be given hormone replacement therapy (HRT) to treat symptoms of menopause. Follow these instructions at home: Lifestyle  Do not use any products that contain nicotine or tobacco, such as cigarettes, e-cigarettes, and chewing tobacco. If you need help quitting, ask your health care provider.  Do not use street drugs.  Do not share needles.  Ask your health care provider for help if you need support or information about quitting drugs. Alcohol use  Do not drink alcohol if: ? Your health care provider tells you not to drink. ? You are pregnant, may be pregnant, or are planning to become pregnant.  If you drink alcohol: ? Limit how much you use to 0-1 drink a day. ? Limit intake if you are breastfeeding.  Be aware of how much alcohol is in your drink. In the [place] one drink equals one 12 oz bottle of beer (355 mL), one 5 oz glass of wine (148 mL), or one 1 oz glass of hard liquor (44 mL). General instructions  Schedule regular health, dental, and eye exams.  Stay current with your vaccines.  Tell your health care provider if: ? You often feel depressed. ? You have ever been abused or do not feel safe at home. Summary  Adopting a healthy lifestyle and getting preventive care are important in promoting health and wellness.  Follow  your health care provider's instructions about healthy diet, exercising, and getting tested or screened for diseases.  Follow your health care provider's instructions on monitoring your cholesterol and blood pressure. This information is not intended to replace advice given to you by your health care provider. Make sure you discuss any questions you have with your health care provider. Document Revised: 04/05/2018 Document Reviewed: 04/05/2018 Elsevier Patient Education  2021 [employer].

## 2020-08-01 ENCOUNTER — Encounter: Payer: Self-pay | Admitting: Internal Medicine

## 2020-08-01 NOTE — Assessment & Plan Note (Signed)
BP at goal, refill losartan/hctz. Checking CMP and adjust as needed.

## 2020-08-01 NOTE — Assessment & Plan Note (Signed)
Flu shot counseled. Covid-19 up to date. Shingrix complete. Tetanus up to date. Colonoscopy up to date. Mammogram up to date, pap smear up to date. Counseled about sun safety and mole surveillance. Counseled about the dangers of distracted driving. Given 10 year screening recommendations.

## 2020-08-05 ENCOUNTER — Other Ambulatory Visit: Payer: Self-pay | Admitting: Internal Medicine

## 2020-08-05 DIAGNOSIS — Z1231 Encounter for screening mammogram for malignant neoplasm of breast: Secondary | ICD-10-CM

## 2020-08-18 ENCOUNTER — Other Ambulatory Visit: Payer: Self-pay

## 2020-08-18 ENCOUNTER — Ambulatory Visit
Admission: RE | Admit: 2020-08-18 | Discharge: 2020-08-18 | Disposition: A | Discharge status: Home / Self Care | Payer: Managed Care, Other (non HMO) | Source: Ambulatory Visit

## 2020-08-18 DIAGNOSIS — Z1231 Encounter for screening mammogram for malignant neoplasm of breast: Secondary | ICD-10-CM

## 2020-09-24 ENCOUNTER — Telehealth: Payer: Self-pay | Admitting: Internal Medicine

## 2020-09-24 ENCOUNTER — Other Ambulatory Visit: Payer: Self-pay

## 2020-09-24 ENCOUNTER — Emergency Department (HOSPITAL_COMMUNITY)
Admission: EM | Admit: 2020-09-24 | Discharge: 2020-09-24 | Disposition: A | Discharge status: Home / Self Care | Payer: Managed Care, Other (non HMO) | Attending: Emergency Medicine | Admitting: Emergency Medicine

## 2020-09-24 ENCOUNTER — Encounter: Payer: Self-pay | Admitting: Internal Medicine

## 2020-09-24 ENCOUNTER — Emergency Department (HOSPITAL_COMMUNITY): Payer: Managed Care, Other (non HMO)

## 2020-09-24 DIAGNOSIS — R200 Anesthesia of skin: Secondary | ICD-10-CM | POA: Diagnosis not present

## 2020-09-24 DIAGNOSIS — M542 Cervicalgia: Secondary | ICD-10-CM | POA: Diagnosis not present

## 2020-09-24 DIAGNOSIS — M25512 Pain in left shoulder: Secondary | ICD-10-CM | POA: Insufficient documentation

## 2020-09-24 DIAGNOSIS — R0789 Other chest pain: Secondary | ICD-10-CM | POA: Diagnosis not present

## 2020-09-24 DIAGNOSIS — Z79899 Other long term (current) drug therapy: Secondary | ICD-10-CM | POA: Diagnosis not present

## 2020-09-24 DIAGNOSIS — I2510 Atherosclerotic heart disease of native coronary artery without angina pectoris: Secondary | ICD-10-CM | POA: Diagnosis not present

## 2020-09-24 DIAGNOSIS — I10 Essential (primary) hypertension: Secondary | ICD-10-CM | POA: Insufficient documentation

## 2020-09-24 LAB — CBC
HCT: 35.9 % — ABNORMAL LOW (ref 36.0–46.0)
Hemoglobin: 12.7 g/dL (ref 12.0–15.0)
MCH: 29 pg (ref 26.0–34.0)
MCHC: 35.4 g/dL (ref 30.0–36.0)
MCV: 82 fL (ref 80.0–100.0)
Platelets: 201 10*3/uL (ref 150–400)
RBC: 4.38 MIL/uL (ref 3.87–5.11)
RDW: 13.4 % (ref 11.5–15.5)
WBC: 6.1 10*3/uL (ref 4.0–10.5)
nRBC: 0 % (ref 0.0–0.2)

## 2020-09-24 LAB — BASIC METABOLIC PANEL
Anion gap: 7 (ref 5–15)
BUN: 9 mg/dL (ref 6–20)
CO2: 25 mmol/L (ref 22–32)
Calcium: 9.1 mg/dL (ref 8.9–10.3)
Chloride: 107 mmol/L (ref 98–111)
Creatinine, Ser: 0.75 mg/dL (ref 0.44–1.00)
GFR, Estimated: >60 mL/min (ref >60)
Glucose, Bld: 96 mg/dL (ref 70–99)
Potassium: 3.4 mmol/L — ABNORMAL LOW (ref 3.5–5.1)
Sodium: 139 mmol/L (ref 135–145)

## 2020-09-24 LAB — I-STAT BETA HCG BLOOD, ED (MC, WL, AP ONLY): I-stat hCG, quantitative: <5 m[IU]/mL (ref <5)

## 2020-09-24 LAB — TROPONIN I (HIGH SENSITIVITY)
Troponin I (High Sensitivity): 3 ng/L (ref <18)
Troponin I (High Sensitivity): 3 ng/L (ref <18)

## 2020-09-24 MED ORDER — METHYLPREDNISOLONE 4 MG PO TBPK: ORAL_TABLET | ORAL | 0 refills | Status: DC

## 2020-09-24 MED ORDER — HYDROCODONE-ACETAMINOPHEN 5-325 MG PO TABS: 2.0000 | ORAL_TABLET | ORAL | 0 refills | Status: DC | PRN

## 2020-09-24 MED ORDER — METHOCARBAMOL 500 MG PO TABS: 500.0000 mg | ORAL_TABLET | Freq: Two times a day (BID) | ORAL | 0 refills | Status: DC

## 2020-09-24 MED ORDER — FENTANYL CITRATE (PF) 100 MCG/2ML IJ SOLN
50.0000 ug | Freq: Once | INTRAMUSCULAR | Status: AC
Start: 2020-09-24 — End: 2020-09-24
  Administered 2020-09-24: 50 ug via INTRAVENOUS
  Filled 2020-09-24: qty 2

## 2020-09-24 MED ORDER — ONDANSETRON HCL 4 MG/2ML IJ SOLN
4.0000 mg | Freq: Once | INTRAMUSCULAR | Status: AC
  Administered 2020-09-24: 4 mg via INTRAVENOUS
  Filled 2020-09-24: qty 2

## 2020-09-24 MED ORDER — CELECOXIB 200 MG PO CAPS: 200.0000 mg | ORAL_CAPSULE | Freq: Two times a day (BID) | ORAL | 0 refills | Status: DC

## 2020-09-24 NOTE — ED Provider Notes (Signed)
[facility]ERGENCY DEPARTMENT Provider Note   CSN: [id number] Arrival date & time: 09/24/20  1141     History Chief Complaint  Patient presents with  . Chest Pain    Aimee Anderson is a 55 y.o. female who presents with a cc of chest pain.  Patient states that 2 days ago she was reaching into her car to let her seats down because she got a new TV.  She had no pain at that time but later on the day she began having significant pain on the left side of her neck, left upper chest wall and left shoulder.  Since that time she has had persistent 8-9 out of 10 pain with known numbness and tingling in her fingers.  She has also had pain whenever she tries to lift her arm or turn her neck.  She denies any nausea, shortness of breath, diaphoresis.  She has a history of hypertension for which she takes blood pressure medication but denies a history of hyperlipidemia, smoking, diabetes, personal history of CAD or family history of MI before the age of 51.  She denies weakness of the left upper extremity  HPI  HPI: A 55 year old patient with a history of hypertension and obesity presents for evaluation of chest pain. Initial onset of pain was less than one hour ago. The patient's chest pain is not worse with exertion. The patient's chest pain is middle- or left-sided, is not well-localized, is not described as heaviness/pressure/tightness, is not sharp and does radiate to the arms/jaw/neck. The patient does not complain of nausea and denies diaphoresis. The patient has no history of stroke, has no history of peripheral artery disease, has not smoked in the past 90 days, denies any history of treated diabetes, has no relevant family history of coronary artery disease (first degree relative at less than age 58) and has no history of hypercholesterolemia.   Past Medical History:  Diagnosis Date  . Allergy   . ALLERGY, HX OF 06/22/2007  . Anemia    History  . Arthritis   . Depression    . FLANK PAIN, RIGHT 04/15/2009  . HYPERTENSION 06/22/2007  . Hypertension   . Sickle cell anemia (HCC)    Sickle cell Trait  . Sickle cell trait ([place])   . SVD (spontaneous vaginal delivery)    x 1  . Uterine fibroid     Patient Active Problem List   Diagnosis Date Noted  . Vitamin D deficiency 07/21/2018  . Routine health maintenance 04/09/2013  . Essential hypertension 06/22/2007    Past Surgical History:  Procedure Laterality Date  . CESAREAN SECTION     x 1  . COLONOSCOPY    . DILATATION & CURETTAGE/HYSTEROSCOPY WITH MYOSURE N/A 01/21/2016   Procedure: DILATATION & CURETTAGE/HYSTEROSCOPY WITH MYOSURE;  Surgeon: Servando Salina, MD;  Location: [facility];  Service: Gynecology;  Laterality: N/A;  . TONSILLECTOMY    . TUBAL LIGATION       OB History    Gravida  5   Para  3   Term  3   Preterm  0   AB  1   Living        SAB  1   IAB  0   Ectopic  0   Multiple      Live Births              Family History  Problem Relation Age of Onset  . Ulcers Father   . Gout  Father   . Diabetes Father   . Heart disease Father   . Bladder Cancer Father   . Hypertension Mother   . Colon cancer Brother   . Breast cancer Neg Hx   . Esophageal cancer Neg Hx   . Liver cancer Neg Hx   . Pancreatic cancer Neg Hx   . Rectal cancer Neg Hx   . Stomach cancer Neg Hx     Social History   Tobacco Use  . Smoking status: Never Smoker  . Smokeless tobacco: Never Used  Vaping Use  . Vaping Use: Never used  Substance Use Topics  . Alcohol use: Yes  . Drug use: No    Home Medications Prior to Admission medications   Medication Sig Start Date End Date Taking? Authorizing Provider  celecoxib (CELEBREX) 200 MG capsule Take 1 capsule (200 mg total) by mouth 2 (two) times daily. 09/24/20  Yes Dowell Hoon, PA-C  HYDROcodone-acetaminophen (NORCO) 5-325 MG tablet Take 2 tablets by mouth every 4 (four) hours as needed. 09/24/20  Yes Karema Tocci, PA-C  methocarbamol  (ROBAXIN) 500 MG tablet Take 1 tablet (500 mg total) by mouth 2 (two) times daily. 09/24/20  Yes Margarita Mail, PA-C  methylPREDNISolone (MEDROL DOSEPAK) 4 MG TBPK tablet Use as directed 09/24/20  Yes Harrol Novello, PA-C  ELDERBERRY PO Take by mouth.    [provider]  losartan-hydrochlorothiazide (HYZAAR) 50-12.5 MG tablet Take 1 tablet by mouth daily. 07/22/20   Hoyt Koch, MD  Multiple Vitamin (MULTIVITAMIN) tablet Take 1 tablet by mouth daily.    [provider]  Omega-3 1000 MG CAPS Take by mouth.    [provider]  OVER THE COUNTER MEDICATION Vitamin C, 3,000 mg daily.    [provider]  OVER THE COUNTER MEDICATION Vitamin B Complex, one tablet daily.    [provider]  OVER THE COUNTER MEDICATION Calcium 600 mg, Once a day.    [provider]    Allergies    Kiwi extract, Penicillins, and Penicillins  Review of Systems   Review of Systems Ten systems reviewed and are negative for acute change, except as noted in the HPI.   Physical Exam Updated Vital Signs BP (!) 134/94 (BP Location: Right Arm)   Pulse 66   Temp 98.1 F (36.7 C) (Oral)   Resp 20   Ht 5\' 2"  (1.575 m)   Wt 74.8 kg   LMP 05/10/2017 (Exact Date)   SpO2 100%   BMI 30.18 kg/m   Physical Exam Vitals and nursing note reviewed.  Constitutional:      General: She is not in acute distress.    Appearance: She is well-developed. She is not diaphoretic.  HENT:     Head: Normocephalic and atraumatic.  Eyes:     General: No scleral icterus.    Conjunctiva/sclera: Conjunctivae normal.  Neck:   Cardiovascular:     Rate and Rhythm: Normal rate and regular rhythm.     Heart sounds: Normal heart sounds. No murmur heard. No friction rub. No gallop.   Pulmonary:     Effort: Pulmonary effort is normal. No respiratory distress.     Breath sounds: Normal breath sounds.  Abdominal:     General: Bowel sounds are normal. There is no distension.      Palpations: Abdomen is soft. There is no mass.     Tenderness: There is no abdominal tenderness. There is no guarding.  Musculoskeletal:     Cervical back: No rigidity.  Pain with movement and muscular tenderness present. No spinous process tenderness. Decreased range of motion.  Skin:    General: Skin is warm and dry.  Neurological:     Mental Status: She is alert and oriented to person, place, and time.  Psychiatric:        Behavior: Behavior normal.     ED Results / Procedures / Treatments   Labs (all labs ordered are listed, but only abnormal results are displayed) Labs Reviewed  BASIC METABOLIC PANEL - Abnormal; Notable for the following components:      Result Value   Potassium 3.4 (*)    All other components within normal limits  CBC - Abnormal; Notable for the following components:   HCT 35.9 (*)    All other components within normal limits  I-STAT BETA HCG BLOOD, ED ([facility], [facility], [facility] ONLY)  TROPONIN I (HIGH SENSITIVITY)  TROPONIN I (HIGH SENSITIVITY)    EKG None  Radiology DG Chest 2 View  Result Date: 09/24/2020 CLINICAL DATA:  Chest pain EXAM: CHEST - 2 VIEW COMPARISON:  Chest radiograph June 23, 2020; chest CT June 24, 2020 FINDINGS: Lungs are clear. Heart size and pulmonary vascularity are within normal limits. No adenopathy. No pneumothorax. No bone lesions. IMPRESSION: Lungs clear.  Cardiac silhouette within normal limits. Electronically Signed   By: Lowella Grip III M.D.   On: 09/24/2020 12:17   CT Cervical Spine Wo Contrast  Result Date: 09/24/2020 CLINICAL DATA:  Neck trauma and pain EXAM: CT CERVICAL SPINE WITHOUT CONTRAST TECHNIQUE: Multidetector CT imaging of the cervical spine was performed without intravenous contrast. Multiplanar CT image reconstructions were also generated. COMPARISON:  06/24/2020 FINDINGS: Alignment: Straightened alignment without subluxation or dislocation. Skull base and vertebrae: No acute fracture. No primary bone lesion or focal  pathologic process. Soft tissues and spinal canal: No prevertebral fluid or swelling. No visible canal hematoma. Disc levels: Multilevel degenerative disc disease spanning C4 through C7. These levels demonstrate disc space narrowing, sclerosis and endplate osteophytes. Degenerative changes of the C1-2 articulation as well. Posterior multilevel facet arthropathy. Upper chest: No acute finding. Dystrophic calcification in the right thyroid noted. Other: None. IMPRESSION: Similar mid and lower cervical degenerative changes as above and multilevel facet arthropathy. No acute cervical spine fracture or malalignment by CT. Electronically Signed   By: Jerilynn Mages.  Shick M.D.   On: 09/24/2020 13:43    Procedures Procedures   Medications Ordered in ED Medications  fentaNYL (SUBLIMAZE) injection 50 mcg (50 mcg Intravenous Given 09/24/20 1358)  ondansetron (ZOFRAN) injection 4 mg (4 mg Intravenous Given 09/24/20 1356)    ED Course  I have reviewed the triage vital signs and the nursing notes.  Pertinent labs & imaging results that were available during my care of the patient were reviewed by me and considered in my medical decision making (see chart for details).    MDM Rules/Calculators/A&P HEAR Score: 3                        Patient here with left-sided chest pain. I ordered and reviewed labs that include CBC, BMP, hCG all negative.  Troponin negative x2.  I ordered and reviewed two-view chest x-ray which shows no acute abnormalities and I ordered and reviewed a CT C-spine which shows advanced degenerative changes. EKG shows sinus rhythm at a rate of 71 Suspect this is cervical radiculopathy and will treat as such.  She has a relationship with Dr. Lyndon Code and I have referred  her back to him for further evaluation.  Given the large differential diagnosis for NANDINI BOGDANSKI, the decision making in this case is of high complexity.  After evaluating all of the data points in this case, the presentation of  ANGELEAH LABRAKE is NOT consistent with Acute Coronary Syndrome (ACS) and/or myocardial ischemia, pulmonary embolism, aortic dissection; Borhaave's, significant arrythmia, pneumothorax, cardiac tamponade, or other emergent cardiopulmonary condition.  Further, the presentation of ADANELY REYNOSO is NOT consistent with pericarditis, myocarditis, cholecystitis, pancreatitis, mediastinitis, endocarditis, new valvular disease.  Additionally, the presentation of Chyenne Sobczak Bookeris NOT consistent with flail chest, cardiac contusion, ARDS, or significant intra-thoracic or intra-abdominal bleeding.  Moreover, this presentation is NOT consistent with pneumonia, sepsis, or pyelonephritis.     Strict return and follow-up precautions have been given by me personally or by detailed written instruction given verbally by nursing staff using the teach back method to the patient/family/caregiver(s).  Data Reviewed/Counseling: I have reviewed the patient's vital signs, nursing notes, and other relevant tests/information. I had a detailed discussion regarding the historical points, exam findings, and any diagnostic results supporting the discharge diagnosis. I also discussed the need for outpatient follow-up and the need to return to the ED if symptoms worsen or if there are any questions or concerns that arise at home.  Final Clinical Impression(s) / ED Diagnoses Final diagnoses:  Neck pain    Rx / DC Orders ED Discharge Orders         Ordered    HYDROcodone-acetaminophen (NORCO) 5-325 MG tablet  Every 4 hours PRN        09/24/20 1536    methocarbamol (ROBAXIN) 500 MG tablet  2 times daily        09/24/20 1536    celecoxib (CELEBREX) 200 MG capsule  2 times daily        09/24/20 1536    methylPREDNISolone (MEDROL DOSEPAK) 4 MG TBPK tablet        09/24/20 1538           Margarita Mail, PA-C 09/24/20 1744    Lucrezia Starch, MD 09/25/20 [phone]

## 2020-09-24 NOTE — Telephone Encounter (Signed)
Patient said that she has been experiencing pain in her upper left chest and down her left side since Monday afternoon. She said that she can feel the pain in her neck and it is tender to touch. Patient said that she has been taking ibuprofen and it is relieving the pain some. Transferred to [facility].

## 2020-09-24 NOTE — ED Notes (Signed)
Patient transported to CT 
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
## 2020-09-24 NOTE — Telephone Encounter (Signed)
[facility] FYI   Caller states she has been having left upper chest pain and neck pain. Caller states the pain medication are not working. Pain began Monday afternoon after eating lunch and progressed into Tuesday; the pain is constant. When she tries to turn her head to the left and use her left arm she has worse pain in the left arm/neck and in the chest. If she tries to lean on her left arm, she has pain in the chest as well.  Advised to GO TO ED NOW: * You need to be seen in the Emergency Department. * Go to the ED at ___________ [place] now. Drive carefully. NOTE TO TRIAGER - DRIVING: * Another adult should drive. * Patient should not delay going to the emergency department. * If immediate transportation is not available via car, rideshare (e.g., Lyft, Uber), or taxi, then the patient should be instructed to call [id number]. NOTHING BY MOUTH: * Do not eat or drink anything for now. CALL EMS IF: * Severe difficulty breathing occurs * Passes out or becomes too weak to stand * You become worse CARE ADVICE given per Chest Pain (Adult) guideline.  Patient understood and agreed.

## 2020-09-24 NOTE — Discharge Instructions (Addendum)
Get help right away if: Your pain gets much worse and cannot be controlled with medicines. You have weakness or numbness in your hand, arm, face, or leg. You have a high fever. You have a stiff, rigid neck. You lose control of your bowels or your bladder (have incontinence). You have trouble with walking, balance, or speaking.

## 2021-01-22 ENCOUNTER — Encounter: Payer: Self-pay | Admitting: Nurse Practitioner

## 2021-01-22 ENCOUNTER — Other Ambulatory Visit: Payer: Self-pay | Admitting: Nurse Practitioner

## 2021-01-22 ENCOUNTER — Ambulatory Visit: Payer: Managed Care, Other (non HMO) | Admitting: Nurse Practitioner

## 2021-01-22 ENCOUNTER — Other Ambulatory Visit: Payer: Self-pay

## 2021-01-22 VITALS — BP 120/70 | HR 85 | Temp 97.5°F | Resp 18 | Ht 62.0 in | Wt 192.7 lb

## 2021-01-22 DIAGNOSIS — I872 Venous insufficiency (chronic) (peripheral): Secondary | ICD-10-CM | POA: Diagnosis not present

## 2021-01-22 DIAGNOSIS — E876 Hypokalemia: Secondary | ICD-10-CM | POA: Diagnosis not present

## 2021-01-22 DIAGNOSIS — I10 Essential (primary) hypertension: Secondary | ICD-10-CM

## 2021-01-22 DIAGNOSIS — R748 Abnormal levels of other serum enzymes: Secondary | ICD-10-CM

## 2021-01-22 LAB — COMPREHENSIVE METABOLIC PANEL
ALT: 62 U/L — ABNORMAL HIGH (ref 0–35)
AST: 69 U/L — ABNORMAL HIGH (ref 0–37)
Albumin: 3.9 g/dL (ref 3.5–5.2)
Alkaline Phosphatase: 54 U/L (ref 39–117)
BUN: 12 mg/dL (ref 6–23)
CO2: 29 mEq/L (ref 19–32)
Calcium: 9.4 mg/dL (ref 8.4–10.5)
Chloride: 106 mEq/L (ref 96–112)
Creatinine, Ser: 0.82 mg/dL (ref 0.40–1.20)
GFR: 79.51 mL/min (ref >60.00)
Glucose, Bld: 87 mg/dL (ref 70–99)
Potassium: 3.4 mEq/L — ABNORMAL LOW (ref 3.5–5.1)
Sodium: 142 mEq/L (ref 135–145)
Total Bilirubin: 0.9 mg/dL (ref 0.2–1.2)
Total Protein: 6.7 g/dL (ref 6.0–8.3)

## 2021-01-22 MED ORDER — POTASSIUM CHLORIDE ER 10 MEQ PO TBCR: 10.0000 meq | EXTENDED_RELEASE_TABLET | Freq: Every day | ORAL | 0 refills | Status: DC

## 2021-01-22 NOTE — Progress Notes (Signed)
Order for potassium supplement and repeat CMP

## 2021-01-22 NOTE — Progress Notes (Signed)
Subjective:  Patient ID: Aimee Anderson, female    DOB: 14-Aug-1965  Age: 55 y.o. MRN: [id number]  CC:  Chief Complaint  Patient presents with   Bilateral Swelling    Legs and feet. Pt states that she is on her feet all day at work.       HPI  This patient arrives today for the above.  She tells me she recently restarted her job full-time, and since then has been noticing that her lower legs are swelling at the end of the day.  They are also sore.  She tells me when she wakes up in the morning the swelling is gone away.  She denies any orthopnea, shortness of breath, worsening fatigue, or exercise intolerance.  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  She is on losartan-hydrochlorothiazide and is tolerating this medication well for hypertension.  Per chart review I see she had very mild hypokalemia last time metabolic panel was checked.  Past Medical History:  Diagnosis Date   Allergy    ALLERGY, HX OF 06/22/2007   Anemia    History   Arthritis    Depression    FLANK PAIN, RIGHT 04/15/2009   HYPERTENSION 06/22/2007   Hypertension    Sickle cell anemia (HCC)    Sickle cell Trait   Sickle cell trait (HCC)    SVD (spontaneous vaginal delivery)    x 1   Uterine fibroid       Family History  Problem Relation Age of Onset   Ulcers Father    Gout Father    Diabetes Father    Heart disease Father    Bladder Cancer Father    Hypertension Mother    Colon cancer Brother    Breast cancer Neg Hx    Esophageal cancer Neg Hx    Liver cancer Neg Hx    Pancreatic cancer Neg Hx    Rectal cancer Neg Hx    Stomach cancer Neg Hx     Social History   Social History Narrative   ** Merged History Encounter **       HSG, married "63, 2 daughters-'94,'98   Self-employed:custom draperies and window treatment, 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   losartan-hydrochlorothiazide (HYZAAR) 50-12.5 MG tablet Take 1 tablet by mouth daily.   Multiple Vitamin (MULTIVITAMIN) tablet Take 1 tablet by mouth daily.   Omega-3 1000 MG CAPS Take by mouth.   OVER THE COUNTER MEDICATION Vitamin C, 3,000 mg daily.   OVER THE COUNTER MEDICATION Vitamin B Complex, one tablet daily.   OVER THE COUNTER MEDICATION Calcium 600 mg, Once a day.    ROS:  See HPI   Objective:   Today's Vitals: BP 120/70   Pulse 85   Temp (!) 97.5 F (36.4 C) (Oral)   Resp 18   Ht $R'5\' 2"'zq$  (1.575 m)   Wt 192 lb 11.2 oz (87.4 kg)   LMP 05/10/2017 (Exact Date)   SpO2 97%   BMI 35.25 kg/m  Vitals with BMI 01/22/2021 09/24/2020 09/24/2020  Height $Remov'5\' 2"'wkcFQI$  - -  Weight 192 lbs 11 oz - -  BMI 38.88 - -  Systolic 280 034 917  Diastolic 70 94 89  Pulse 85 66 64     Physical Exam Vitals reviewed.  Constitutional:      General: She is not in acute distress.    Appearance: Normal appearance.  HENT:     Head: Normocephalic and atraumatic.  Neck:     Vascular: No carotid bruit.  Cardiovascular:     Rate and Rhythm: Normal rate and regular rhythm.     Pulses: Normal pulses.     Heart sounds: Normal heart sounds.  Pulmonary:     Effort: Pulmonary effort is normal.     Breath sounds: Normal breath sounds.  Musculoskeletal:     Right lower leg: Edema present.     Left lower leg: Edema present.  Skin:    General: Skin is warm and dry.  Neurological:     General: No focal deficit present.     Mental Status: She is alert and oriented to person, place, and time.  Psychiatric:        Mood and Affect: Mood normal.        Behavior: Behavior normal.        Judgment: Judgment normal.         Assessment and Plan   1. Essential hypertension   2. Hypokalemia   3. Venous insufficiency      Plan: 1.  She will continue on her antihypertensives as currently prescribed. 2.  We will check CMP  to monitor hypokalemia may consider adding low-dose potassium supplement pending results. 3.  I believe the swelling is from venous insufficiency, low concern for heart failure based on her symptoms.  Recommended she try compression stockings for the next month, and if symptoms persist despite trying compression stockings consider following back up for further evaluation.   Tests ordered Orders Placed This Encounter  Procedures   Comp Met (CMET)      No orders of the defined types were placed in this encounter.   Patient to follow-up in 1 month or sooner as needed with PCP for close monitoring of lower extremity swelling.  Ailene Ards, NP

## 2021-02-23 ENCOUNTER — Other Ambulatory Visit: Payer: Self-pay

## 2021-02-23 ENCOUNTER — Encounter: Payer: Self-pay | Admitting: Internal Medicine

## 2021-02-23 ENCOUNTER — Ambulatory Visit (INDEPENDENT_AMBULATORY_CARE_PROVIDER_SITE_OTHER): Payer: Managed Care, Other (non HMO) | Admitting: Internal Medicine

## 2021-02-23 VITALS — BP 114/80 | HR 61 | Resp 18 | Ht 62.0 in | Wt 190.4 lb

## 2021-02-23 DIAGNOSIS — E876 Hypokalemia: Secondary | ICD-10-CM

## 2021-02-23 DIAGNOSIS — R053 Chronic cough: Secondary | ICD-10-CM

## 2021-02-23 DIAGNOSIS — G5603 Carpal tunnel syndrome, bilateral upper limbs: Secondary | ICD-10-CM

## 2021-02-23 DIAGNOSIS — R748 Abnormal levels of other serum enzymes: Secondary | ICD-10-CM

## 2021-02-23 DIAGNOSIS — I10 Essential (primary) hypertension: Secondary | ICD-10-CM

## 2021-02-23 LAB — COMPREHENSIVE METABOLIC PANEL
ALT: 32 U/L (ref 0–35)
AST: 23 U/L (ref 0–37)
Albumin: 4.1 g/dL (ref 3.5–5.2)
Alkaline Phosphatase: 49 U/L (ref 39–117)
BUN: 15 mg/dL (ref 6–23)
CO2: 30 mEq/L (ref 19–32)
Calcium: 9.4 mg/dL (ref 8.4–10.5)
Chloride: 102 mEq/L (ref 96–112)
Creatinine, Ser: 0.82 mg/dL (ref 0.40–1.20)
GFR: 79.46 mL/min (ref >60.00)
Glucose, Bld: 90 mg/dL (ref 70–99)
Potassium: 3.8 mEq/L (ref 3.5–5.1)
Sodium: 138 mEq/L (ref 135–145)
Total Bilirubin: 1.2 mg/dL (ref 0.2–1.2)
Total Protein: 7.1 g/dL (ref 6.0–8.3)

## 2021-02-23 NOTE — Progress Notes (Signed)
   Subjective:   Patient ID: Aimee Anderson, female    DOB: 1966/01/19, 55 y.o.   MRN: [id number]  HPI The patient is a 55 YO female coming in for several concerns.   Review of Systems  Constitutional: Negative.   HENT: Negative.    Eyes: Negative.   Respiratory:  Positive for cough. Negative for chest tightness and shortness of breath.   Cardiovascular:  Negative for chest pain, palpitations and leg swelling.  Gastrointestinal:  Negative for abdominal distention, abdominal pain, constipation, diarrhea, nausea and vomiting.  Musculoskeletal:  Positive for myalgias.  Skin: Negative.   Neurological:  Positive for numbness.  Psychiatric/Behavioral: Negative.     Objective:  Physical Exam Constitutional:      Appearance: She is well-developed.  HENT:     Head: Normocephalic and atraumatic.  Cardiovascular:     Rate and Rhythm: Normal rate and regular rhythm.  Pulmonary:     Effort: Pulmonary effort is normal. No respiratory distress.     Breath sounds: Normal breath sounds. No wheezing or rales.  Abdominal:     General: Bowel sounds are normal. There is no distension.     Palpations: Abdomen is soft.     Tenderness: There is no abdominal tenderness. There is no rebound.  Musculoskeletal:        General: Tenderness present.     Cervical back: Normal range of motion.  Skin:    General: Skin is warm and dry.  Neurological:     Mental Status: She is alert and oriented to person, place, and time.     Coordination: Coordination normal.    Vitals:   02/23/21 1120  BP: 114/80  Pulse: 61  Resp: 18  SpO2: 98%  Weight: 190 lb 6.4 oz (86.4 kg)  Height: 5\' 2"  (1.575 m)    This visit occurred during the SARS-CoV-2 public health emergency.  Safety protocols were in place, including screening questions prior to the visit, additional usage of staff PPE, and extensive cleaning of exam room while observing appropriate contact time as indicated for disinfecting solutions.   Assessment  & Plan:

## 2021-02-23 NOTE — Patient Instructions (Addendum)
You can try the carpal tunnel brace for the wrists. It is okay to try voltaren gel as well over the counter for this.  We will check the labs today.

## 2021-02-24 DIAGNOSIS — G5600 Carpal tunnel syndrome, unspecified upper limb: Secondary | ICD-10-CM | POA: Insufficient documentation

## 2021-02-24 DIAGNOSIS — R059 Cough, unspecified: Secondary | ICD-10-CM | POA: Insufficient documentation

## 2021-02-24 DIAGNOSIS — R748 Abnormal levels of other serum enzymes: Secondary | ICD-10-CM | POA: Insufficient documentation

## 2021-02-24 NOTE — Assessment & Plan Note (Signed)
She recently was taking more ibuprofen if the only change in medication. Rechecking CMP today to assess. If persistently elevated we can consider serial monitoring or RUQ [place] to evaluate.

## 2021-02-24 NOTE — Assessment & Plan Note (Signed)
Had hypokalemia last visit and given short supply of kdur which she has taken. Rechecking CMP today. Adjust losartan/hctz 50/12.5 as needed. Reassurance given that losartan does not cause cough and lungs clear.

## 2021-02-24 NOTE — Assessment & Plan Note (Signed)
Has had this previously and is now working full time again and having some recurrence. Advised to get otc carpal tunnel braces and sleep in these. If not improvement we talked about further treatment options.

## 2021-02-24 NOTE — Assessment & Plan Note (Signed)
Checking CMP today after taking a course of potassium. Likely HCTZ is contributing. She had normal magnesium about 1 month ago. Adjust as needed her losartan/hctz. We could increase the losartan relative to the hctz if needed to 100/12.5 mg to help with the balance of potassium wasting and sparing in her medications.

## 2021-02-24 NOTE — Assessment & Plan Note (Signed)
We talked about most common etiology allergies, pnd, GERD. She denies overt symptoms of these and asked to try otc antihistamine for a week or so to see if this helps. Reassurance given that losartan is not contributing to the cough.

## 2021-07-06 ENCOUNTER — Other Ambulatory Visit: Payer: Self-pay | Admitting: Internal Medicine

## 2021-07-06 ENCOUNTER — Telehealth: Payer: Self-pay | Admitting: Internal Medicine

## 2021-07-06 DIAGNOSIS — N644 Mastodynia: Secondary | ICD-10-CM

## 2021-07-06 NOTE — Telephone Encounter (Signed)
Pt requesting a mammogram referral ? ?

## 2021-07-08 NOTE — Telephone Encounter (Signed)
For screening or because of a new problem? Not due for screening until end of April we can place order. ?

## 2021-07-27 NOTE — Telephone Encounter (Signed)
Pt is having pain in L breast.  ? ?Please place referral to [facility].  ?

## 2021-07-27 NOTE — Addendum Note (Signed)
Addended by: Pricilla Holm A on: 07/27/2021 01:36 PM ? ? Modules accepted: Orders ? ?

## 2021-07-27 NOTE — Telephone Encounter (Signed)
Orders placed.

## 2021-08-20 ENCOUNTER — Other Ambulatory Visit: Payer: Self-pay | Admitting: Internal Medicine

## 2021-08-28 ENCOUNTER — Ambulatory Visit
Admission: RE | Admit: 2021-08-28 | Discharge: 2021-08-28 | Disposition: A | Discharge status: Home / Self Care | Payer: Managed Care, Other (non HMO) | Source: Ambulatory Visit | Attending: Internal Medicine | Admitting: Internal Medicine

## 2021-08-28 ENCOUNTER — Ambulatory Visit: Payer: Managed Care, Other (non HMO)

## 2021-08-28 DIAGNOSIS — N644 Mastodynia: Secondary | ICD-10-CM

## 2021-09-17 ENCOUNTER — Telehealth: Payer: Self-pay | Admitting: Internal Medicine

## 2021-09-17 NOTE — Telephone Encounter (Signed)
Ok to send in a short supply or does she need to be seen in office beforehand?

## 2021-09-17 NOTE — Telephone Encounter (Signed)
1.Medication Requested: losartan-hydrochlorothiazide (HYZAAR) 50-12.5 MG tablet 2. Pharmacy (Name, [place], [facility]): [address] [place], [place] Phone:  [phone]  Fax:  [phone]     3. On Med List: yes  4. Last Visit with PCP:  5. Next visit date with PCP:   Agent: Please be advised that RX refills may take up to 3 business days. We ask that you follow-up with your pharmacy.

## 2021-09-17 NOTE — Telephone Encounter (Signed)
Requesting short supply.

## 2021-09-18 ENCOUNTER — Other Ambulatory Visit: Payer: Self-pay | Admitting: Internal Medicine

## 2021-09-18 NOTE — Telephone Encounter (Signed)
Refill has been sent to the pt's pharmacy  

## 2021-09-18 NOTE — Telephone Encounter (Signed)
We just saw her in October I am not sure why you are asking me about refill?

## 2021-09-23 ENCOUNTER — Ambulatory Visit: Payer: Managed Care, Other (non HMO) | Admitting: Internal Medicine

## 2021-10-06 ENCOUNTER — Encounter: Payer: Self-pay | Admitting: Internal Medicine

## 2021-10-06 ENCOUNTER — Ambulatory Visit (INDEPENDENT_AMBULATORY_CARE_PROVIDER_SITE_OTHER): Payer: Managed Care, Other (non HMO) | Admitting: Internal Medicine

## 2021-10-06 VITALS — BP 118/84 | HR 70 | Resp 18 | Ht 62.0 in | Wt 194.2 lb

## 2021-10-06 DIAGNOSIS — Z0000 Encounter for general adult medical examination without abnormal findings: Secondary | ICD-10-CM | POA: Diagnosis not present

## 2021-10-06 DIAGNOSIS — I10 Essential (primary) hypertension: Secondary | ICD-10-CM | POA: Diagnosis not present

## 2021-10-06 DIAGNOSIS — R053 Chronic cough: Secondary | ICD-10-CM

## 2021-10-06 LAB — LIPID PANEL
Cholesterol: 188 mg/dL (ref 0–200)
HDL: 71.1 mg/dL (ref >39.00)
LDL Cholesterol: 98 mg/dL (ref 0–99)
NonHDL: 116.53
Total CHOL/HDL Ratio: 3
Triglycerides: 94 mg/dL (ref 0.0–149.0)
VLDL: 18.8 mg/dL (ref 0.0–40.0)

## 2021-10-06 MED ORDER — LOSARTAN POTASSIUM-HCTZ 50-12.5 MG PO TABS: 1.0000 | ORAL_TABLET | Freq: Every day | ORAL | 3 refills | Status: DC

## 2021-10-06 NOTE — Progress Notes (Signed)
   Subjective:   Patient ID: Aimee Anderson, female    DOB: 08/02/1965, 56 y.o.   MRN: [id number]  HPI The patient is here for physical.  PMH, [facility], social history reviewed and updated  Review of Systems  Constitutional: Negative.   HENT: Negative.    Eyes: Negative.   Respiratory:  Negative for cough, chest tightness and shortness of breath.   Cardiovascular:  Negative for chest pain, palpitations and leg swelling.  Gastrointestinal:  Negative for abdominal distention, abdominal pain, constipation, diarrhea, nausea and vomiting.  Musculoskeletal: Negative.   Skin: Negative.   Neurological: Negative.   Psychiatric/Behavioral: Negative.      Objective:  Physical Exam Constitutional:      Appearance: She is well-developed.  HENT:     Head: Normocephalic and atraumatic.  Cardiovascular:     Rate and Rhythm: Normal rate and regular rhythm.  Pulmonary:     Effort: Pulmonary effort is normal. No respiratory distress.     Breath sounds: Normal breath sounds. No wheezing or rales.  Abdominal:     General: Bowel sounds are normal. There is no distension.     Palpations: Abdomen is soft.     Tenderness: There is no abdominal tenderness. There is no rebound.  Musculoskeletal:     Cervical back: Normal range of motion.  Skin:    General: Skin is warm and dry.  Neurological:     Mental Status: She is alert and oriented to person, place, and time.     Coordination: Coordination normal.     Vitals:   10/06/21 1422  BP: 118/84  Pulse: 70  Resp: 18  SpO2: 100%  Weight: 194 lb 3.2 oz (88.1 kg)  Height: '5\' 2"'$  (1.575 m)    Assessment & Plan:

## 2021-10-06 NOTE — Patient Instructions (Addendum)
Try zyrtec daily for 1-2 weeks.

## 2021-10-09 NOTE — Assessment & Plan Note (Signed)
Flu shot yearly. Covid-19 counseled. Shingrix complete. Tetanus up to date. Colonoscopy up to date. Mammogram up to date, pap smear up to date. Counseled about sun safety and mole surveillance. Counseled about the dangers of distracted driving. Given 10 year screening recommendations.   

## 2021-10-09 NOTE — Assessment & Plan Note (Signed)
Ongoing and advised flonase 1-2 weeks or zyrtec. If no improvement can try omeprazole but would need 2-3 months of treatment before resolution of cough if GERD is cause.

## 2021-10-09 NOTE — Assessment & Plan Note (Signed)
Checking lipid panel. Recent CMP reviewed and no changes needed to losartan/hctz 50/12.5 mg daily. Continue.

## 2022-02-01 ENCOUNTER — Ambulatory Visit: Payer: Managed Care, Other (non HMO) | Admitting: Internal Medicine

## 2022-02-24 ENCOUNTER — Ambulatory Visit: Payer: Managed Care, Other (non HMO) | Admitting: Family Medicine

## 2022-02-24 ENCOUNTER — Encounter: Payer: Self-pay | Admitting: Family Medicine

## 2022-02-24 VITALS — BP 140/78 | HR 79 | Temp 97.8°F | Ht 62.0 in | Wt 199.0 lb

## 2022-02-24 DIAGNOSIS — H6123 Impacted cerumen, bilateral: Secondary | ICD-10-CM | POA: Insufficient documentation

## 2022-02-24 DIAGNOSIS — G43019 Migraine without aura, intractable, without status migrainosus: Secondary | ICD-10-CM | POA: Insufficient documentation

## 2022-02-24 DIAGNOSIS — I10 Essential (primary) hypertension: Secondary | ICD-10-CM

## 2022-02-24 LAB — TSH: TSH: 1.92 u[IU]/mL (ref 0.35–5.50)

## 2022-02-24 LAB — COMPREHENSIVE METABOLIC PANEL
ALT: 14 U/L (ref 0–35)
AST: 17 U/L (ref 0–37)
Albumin: 4 g/dL (ref 3.5–5.2)
Alkaline Phosphatase: 45 U/L (ref 39–117)
BUN: 12 mg/dL (ref 6–23)
CO2: 28 mEq/L (ref 19–32)
Calcium: 9.5 mg/dL (ref 8.4–10.5)
Chloride: 107 mEq/L (ref 96–112)
Creatinine, Ser: 0.83 mg/dL (ref 0.40–1.20)
GFR: 77.76 mL/min (ref >60.00)
Glucose, Bld: 91 mg/dL (ref 70–99)
Potassium: 3.5 mEq/L (ref 3.5–5.1)
Sodium: 141 mEq/L (ref 135–145)
Total Bilirubin: 0.8 mg/dL (ref 0.2–1.2)
Total Protein: 7 g/dL (ref 6.0–8.3)

## 2022-02-24 LAB — CBC WITH DIFFERENTIAL/PLATELET
Basophils Absolute: 0 10*3/uL (ref 0.0–0.1)
Basophils Relative: 0.9 % (ref 0.0–3.0)
Eosinophils Absolute: 0.1 10*3/uL (ref 0.0–0.7)
Eosinophils Relative: 1.9 % (ref 0.0–5.0)
HCT: 37 % (ref 36.0–46.0)
Hemoglobin: 12.6 g/dL (ref 12.0–15.0)
Lymphocytes Relative: 45.8 % (ref 12.0–46.0)
Lymphs Abs: 2.4 10*3/uL (ref 0.7–4.0)
MCHC: 33.9 g/dL (ref 30.0–36.0)
MCV: 85.2 fl (ref 78.0–100.0)
Monocytes Absolute: 0.4 10*3/uL (ref 0.1–1.0)
Monocytes Relative: 8.4 % (ref 3.0–12.0)
Neutro Abs: 2.3 10*3/uL (ref 1.4–7.7)
Neutrophils Relative %: 43 % (ref 43.0–77.0)
Platelets: 188 10*3/uL (ref 150.0–400.0)
RBC: 4.35 Mil/uL (ref 3.87–5.11)
RDW: 14.4 % (ref 11.5–15.5)
WBC: 5.3 10*3/uL (ref 4.0–10.5)

## 2022-02-24 MED ORDER — IBUPROFEN 600 MG PO TABS: 600.0000 mg | ORAL_TABLET | Freq: Three times a day (TID) | ORAL | 0 refills | Status: DC | PRN

## 2022-02-24 MED ORDER — KETOROLAC TROMETHAMINE 60 MG/2ML IM SOLN
60.0000 mg | Freq: Once | INTRAMUSCULAR | Status: AC
  Administered 2022-02-24: 60 mg via INTRAMUSCULAR

## 2022-02-24 NOTE — Progress Notes (Signed)
Subjective:     Patient ID: Aimee Anderson, female    DOB: [birth year], 56 y.o.   MRN: [id number]  Chief Complaint  Patient presents with   Migraine    x3 days, usually happens once a month    Migraine    Patient is in today for a migraine headache x 3 days. Pain is mainly behind her left eye. Light sensitivity. No aura  No N/V.   Hx of migraines monthly. She used to have them with her cycles. She thought they had stopped at one point when she lost weight. Has gained weight recently.   Wax in ears.   No fever, chills, dizziness, chest pain, palpitations, shortness of breath, abdominal pain.  No numbness, tingling or weakness.    Health Maintenance Due  Topic Date Due   HIV Screening  Never done   Hepatitis C Screening  Never done   INFLUENZA VACCINE  Never done    Past Medical History:  Diagnosis Date   Allergy    ALLERGY, HX OF 06/22/2007   Anemia    History   Arthritis    Depression    FLANK PAIN, RIGHT 04/15/2009   HYPERTENSION 06/22/2007   Hypertension    Sickle cell anemia (HCC)    Sickle cell Trait   Sickle cell trait (HCC)    SVD (spontaneous vaginal delivery)    x 1   Uterine fibroid     Past Surgical History:  Procedure Laterality Date   CESAREAN SECTION     x 1   COLONOSCOPY     DILATATION & CURETTAGE/HYSTEROSCOPY WITH MYOSURE N/A 01/21/2016   Procedure: [place];  Surgeon: Servando Salina, MD;  Location: [facility];  Service: Gynecology;  Laterality: N/A;   TONSILLECTOMY     TUBAL LIGATION      Family History  Problem Relation Age of Onset   Hypertension Mother    Ulcers Father    Gout Father    Diabetes Father    Heart disease Father    Bladder Cancer Father    Breast cancer Maternal Grandmother    Colon cancer Brother    Esophageal cancer Neg Hx    Liver cancer Neg Hx    Pancreatic cancer Neg Hx    Rectal cancer Neg Hx    Stomach cancer Neg Hx     Social History   Socioeconomic History    Marital status: Single    Spouse name: Not on file   Number of children: 2   Years of education: Not on file   Highest education level: Not on file  Occupational History   Occupation: Self Employed  Tobacco Use   Smoking status: Never   Smokeless tobacco: Never  Vaping Use   Vaping Use: Never used  Substance and Sexual Activity   Alcohol use: Yes   Drug use: No   Sexual activity: Not on file  Other Topics Concern   Not on file  Social History Narrative   ** Merged History Encounter **       HSG, married "71, 2 daughters-'94,'98   Self-employed:custom draperies and window treatment, 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     losartan-hydrochlorothiazide (HYZAAR) 50-12.5 MG tablet Take 1 tablet by mouth daily. 90 tablet 3   Multiple Vitamin (MULTIVITAMIN) tablet Take 1 tablet by mouth daily.     Omega-3 1000 MG CAPS Take by mouth.     OVER THE COUNTER MEDICATION Vitamin C, 3,000 mg daily.     OVER THE COUNTER MEDICATION Vitamin B Complex, one tablet daily.     OVER THE COUNTER MEDICATION Calcium 600 mg, Once a day.     PREVIDENT 5000 SENSITIVE 1.1-5 % GEL Take 1 application by mouth at bedtime.     No facility-administered medications prior to visit.    Allergies  Allergen Reactions   Kiwi Extract Anaphylaxis   Penicillins    Penicillins Hives    Has patient had a PCN reaction causing immediate rash, facial/tongue/throat swelling, SOB or lightheadedness with hypotension: Yes Has patient had a PCN reaction causing severe rash involving mucus membranes or skin necrosis: Yes Has patient had a PCN reaction that  required hospitalization No Has patient had a PCN reaction occurring within the last 10 years: No If all of the above answers are "NO", then may proceed with Cephalosporin use.     ROS     Objective:    Physical Exam Constitutional:      General: She is not in acute distress.    Appearance: She is not ill-appearing.  HENT:     Right Ear: External ear normal. There is impacted cerumen.     Left Ear: External ear normal. There is impacted cerumen.     Nose: Nose normal.     Mouth/Throat:     Mouth: Mucous membranes are moist.     Pharynx: Oropharynx is clear.  Eyes:     Extraocular Movements: Extraocular movements intact.     Conjunctiva/sclera: Conjunctivae normal.     Pupils: Pupils are equal, round, and reactive to light.  Cardiovascular:     Rate and Rhythm: Normal rate and regular rhythm.  Pulmonary:     Effort: Pulmonary effort is normal.     Breath sounds: Normal breath sounds.  Musculoskeletal:        General: Normal range of motion.     Cervical back: Normal range of motion and neck supple. No tenderness.  Lymphadenopathy:     Cervical: No cervical adenopathy.  Skin:    General: Skin is warm and dry.  Neurological:     General: No focal deficit present.     Mental Status: She is alert and oriented to person, place, and time.     Cranial Nerves: No cranial nerve deficit.     Sensory: No sensory deficit.     Motor: No weakness.     Coordination: Coordination normal.     Gait: Gait normal.  Psychiatric:        Mood and Affect: Mood normal.        Behavior: Behavior normal.        Thought Content: Thought content normal.     BP (!) 140/78 (BP Location: Left Arm, Patient Position: Sitting, Cuff Size: Large)   Pulse 79   Temp 97.8 F (36.6 C) (Temporal)   Ht '5\' 2"'$  (1.575 m)   Wt 199 lb (90.3 kg)   LMP 05/10/2017 (Exact Date)   SpO2 97%   BMI 36.40 kg/m  Wt Readings from Last 3 Encounters:  02/24/22 199 lb (90.3 kg)  10/06/21 194 lb 3.2  oz (88.1 kg)   02/23/21 190 lb 6.4 oz (86.4 kg)       Assessment & Plan:   Problem List Items Addressed This Visit       Cardiovascular and Mediastinum   Essential hypertension    Blood pressure most likely elevated today due to pain.  She will continue to monitor blood pressure at home and continue taking daily medication.      Relevant Orders   CBC with Differential/Platelet (Completed)   Comprehensive metabolic panel (Completed)   Intractable migraine without aura and without status migrainosus - Primary    Toradol 60 mg IM injection given in office.  She reported improvement in headache prior to discharge today.  Ibuprofen 600 mg prescribed to take as needed for headaches in the future.  She reports monthly headaches.  Encouraged her to look for headache triggers.  Counseling on staying hydrated, getting regular sleep and avoiding fluctuations in caffeine or alcohol.  Check labs.  She will follow-up with her PCP      Relevant Medications   ibuprofen (ADVIL) 600 MG tablet   Other Relevant Orders   TSH (Completed)     Nervous and Auditory   Bilateral impacted cerumen    Ear lavage performed by CMA due to bilateral cerumen impaction.  Post lavage, she noted significant improvement in ear fullness and hearing.      Relevant Orders   Ear Lavage    I am having Thayer Headings B. Macintyre start on ibuprofen. I am also having her maintain her multivitamin, OVER THE COUNTER MEDICATION, OVER THE COUNTER MEDICATION, OVER THE COUNTER MEDICATION, Omega-3, ELDERBERRY PO, PreviDent 5000 Sensitive, and losartan-hydrochlorothiazide. We administered ketorolac.  Meds ordered this encounter  Medications   ibuprofen (ADVIL) 600 MG tablet    Sig: Take 1 tablet (600 mg total) by mouth every 8 (eight) hours as needed.    Dispense:  30 tablet    Refill:  0    Order Specific Question:   Supervising Provider    Answer:   Pricilla Holm A [[id number]]   ketorolac (TORADOL) injection 60 mg

## 2022-02-24 NOTE — Assessment & Plan Note (Addendum)
Toradol 60 mg IM injection given in office.  She reported improvement in headache prior to discharge today.  Ibuprofen 600 mg prescribed to take as needed for headaches in the future.  She reports monthly headaches.  Encouraged her to look for headache triggers.  Counseling on staying hydrated, getting regular sleep and avoiding fluctuations in caffeine or alcohol.  Check labs.  She will follow-up with her PCP

## 2022-02-24 NOTE — Assessment & Plan Note (Signed)
Ear lavage performed by CMA due to bilateral cerumen impaction.  Post lavage, she noted significant improvement in ear fullness and hearing.

## 2022-02-24 NOTE — Patient Instructions (Addendum)
Please go to the lab downstairs before you leave.   You received a Toradol 60 mg IM injection in the office.   Stay well hydrated, avoid skipping meals, get regular sleep and avoid fluctuations in caffeine.   Look for headache triggers and report them to Dr. Sharlet Salina at your follow up visit.    Migraine Headache A migraine headache is an intense, throbbing pain on one side or both sides of the head. Migraine headaches may also cause other symptoms, such as nausea, vomiting, and sensitivity to light and noise. A migraine headache can last from 4 hours to 3 days. Talk with your doctor about what things may bring on (trigger) your migraine headaches. What are the causes? The exact cause of this condition is not known. However, a migraine may be caused when nerves in the brain become irritated and release chemicals that cause inflammation of blood vessels. This inflammation causes pain. This condition may be triggered or caused by: Drinking alcohol. Smoking. Taking medicines, such as: Medicine used to treat chest pain (nitroglycerin). Birth control pills. Estrogen. Certain blood pressure medicines. Eating or drinking products that contain nitrates, glutamate, aspartame, or tyramine. Aged cheeses, chocolate, or caffeine may also be triggers. Doing physical activity. Other things that may trigger a migraine headache include: Menstruation. Pregnancy. Hunger. Stress. Lack of sleep or too much sleep. Weather changes. Fatigue. What increases the risk? The following factors may make you more likely to experience migraine headaches: Being a certain age. This condition is more common in people who are 23-26 years old. Being female. Having a family history of migraine headaches. Being Caucasian. Having a mental health condition, such as depression or anxiety. Being obese. What are the signs or symptoms? The main symptom of this condition is pulsating or throbbing pain. This pain may: Happen  in any area of the head, such as on one side or both sides. Interfere with daily activities. Get worse with physical activity. Get worse with exposure to bright lights or loud noises. Other symptoms may include: Nausea. Vomiting. Dizziness. General sensitivity to bright lights, loud noises, or smells. Before you get a migraine headache, you may get warning signs (an aura). An aura may include: Seeing flashing lights or having blind spots. Seeing bright spots, halos, or zigzag lines. Having tunnel vision or blurred vision. Having numbness or a tingling feeling. Having trouble talking. Having muscle weakness. Some people have symptoms after a migraine headache (postdromal phase), such as: Feeling tired. Difficulty concentrating. How is this diagnosed? A migraine headache can be diagnosed based on: Your symptoms. A physical exam. Tests, such as: CT scan or an MRI of the head. These imaging tests can help rule out other causes of headaches. Taking fluid from the spine (lumbar puncture) and analyzing it (cerebrospinal fluid analysis, or CSF analysis). How is this treated? This condition may be treated with medicines that: Relieve pain. Relieve nausea. Prevent migraine headaches. Treatment for this condition may also include: Acupuncture. Lifestyle changes like avoiding foods that trigger migraine headaches. Biofeedback. Cognitive behavioral therapy. Follow these instructions at home: Medicines Take over-the-counter and prescription medicines only as told by your health care provider. Ask your health care provider if the medicine prescribed to you: Requires you to avoid driving or using heavy machinery. Can cause constipation. You may need to take these actions to prevent or treat constipation: Drink enough fluid to keep your urine pale yellow. Take over-the-counter or prescription medicines. Eat foods that are high in fiber, such as beans, whole grains,  and fresh fruits and  vegetables. Limit foods that are high in fat and processed sugars, such as fried or sweet foods. Lifestyle Do not drink alcohol. Do not use any products that contain nicotine or tobacco, such as cigarettes, e-cigarettes, and chewing tobacco. If you need help quitting, ask your health care provider. Get at least 8 hours of sleep every night. Find ways to manage stress, such as meditation, deep breathing, or yoga. General instructions Keep a journal to find out what may trigger your migraine headaches. For example, write down: What you eat and drink. How much sleep you get. Any change to your diet or medicines. If you have a migraine headache: Avoid things that make your symptoms worse, such as bright lights. It may help to lie down in a dark, quiet room. Do not drive or use heavy machinery. Ask your health care provider what activities are safe for you while you are experiencing symptoms. Keep all follow-up visits as told by your health care provider. This is important. Contact a health care provider if: You develop symptoms that are different or more severe than your usual migraine headache symptoms. You have more than 15 headache days in one month. Get help right away if: Your migraine headache becomes severe. Your migraine headache lasts longer than 72 hours. You have a fever. You have a stiff neck. You have vision loss. Your muscles feel weak or like you cannot control them. You start to lose your balance often. You have trouble walking. You faint. You have a seizure. Summary A migraine headache is an intense, throbbing pain on one side or both sides of the head. Migraines may also cause other symptoms, such as nausea, vomiting, and sensitivity to light and noise. This condition may be treated with medicines and lifestyle changes. You may also need to avoid certain things that trigger a migraine headache. Keep a journal to find out what may trigger your migraine  headaches. Contact your health care provider if you have more than 15 headache days in a month or you develop symptoms that are different or more severe than your usual migraine headache symptoms. This information is not intended to replace advice given to you by your health care provider. Make sure you discuss any questions you have with your health care provider. Document Revised: 09/24/2021 Document Reviewed: 05/25/2018 [employer] Patient Education  [place].

## 2022-02-24 NOTE — Assessment & Plan Note (Signed)
Blood pressure most likely elevated today due to pain.  She will continue to monitor blood pressure at home and continue taking daily medication.

## 2022-02-24 NOTE — Progress Notes (Signed)
PRE-PROCEDURE EXAM: bilateral TM cannot be visualized due to total occlusion/impaction of the ear canals.  PROCEDURE INDICATION: remove wax to visualize ear drum & relieve discomfort  CONSENT:  Verbal     PROCEDURE NOTE:   BILATERAL EARS:  I used warm water irrigation under direct visualization with the otoscope to free the wax bolus from the ear canal.    POST- PROCEDURE EXAM: TMs successfully visualized and found to have no erythema     The patient tolerated the procedure well.

## 2022-03-24 ENCOUNTER — Encounter: Payer: Self-pay | Admitting: Internal Medicine

## 2022-03-24 ENCOUNTER — Ambulatory Visit: Payer: Managed Care, Other (non HMO) | Admitting: Internal Medicine

## 2022-03-24 VITALS — BP 140/80 | HR 69 | Temp 97.7°F | Ht 62.0 in | Wt 201.0 lb

## 2022-03-24 DIAGNOSIS — I10 Essential (primary) hypertension: Secondary | ICD-10-CM

## 2022-03-24 DIAGNOSIS — G43019 Migraine without aura, intractable, without status migrainosus: Secondary | ICD-10-CM | POA: Diagnosis not present

## 2022-03-24 MED ORDER — UBRELVY 50 MG PO TABS: 50.0000 mg | ORAL_TABLET | Freq: Every day | ORAL | 2 refills | Status: DC | PRN

## 2022-03-24 NOTE — Assessment & Plan Note (Signed)
Rx ubrelvy 50 mg to take prn migraines. She is having 1 migraine per month lasting 2-3 days. Does not qualify for preventative treatment. Also can use ibuprofen 600 mg prn for headaches.

## 2022-03-24 NOTE — Patient Instructions (Signed)
We have sent in ubrelvy to take 1 pill at the start of a migraine. If no relief you can take a second one an hour later.

## 2022-03-24 NOTE — Assessment & Plan Note (Signed)
BP borderline today likely due to taking ibuprofen 600 mg this morning. She has previously had normal readings. Will plan to continue losartan/hctz 50/12.5 mg daily.

## 2022-03-24 NOTE — Progress Notes (Signed)
   Subjective:   Patient ID: Aimee Anderson, female    DOB: 02/15/1966, 56 y.o.   MRN: [id number]  Migraine  Pertinent negatives include no abdominal pain, coughing, nausea or vomiting.   The patient is a 56 YO female coming in for follow up headaches  Review of Systems  Constitutional: Negative.   HENT: Negative.    Eyes: Negative.   Respiratory:  Negative for cough, chest tightness and shortness of breath.   Cardiovascular:  Negative for chest pain, palpitations and leg swelling.  Gastrointestinal:  Negative for abdominal distention, abdominal pain, constipation, diarrhea, nausea and vomiting.  Musculoskeletal: Negative.   Skin: Negative.   Neurological:  Positive for headaches.  Psychiatric/Behavioral: Negative.      Objective:  Physical Exam Constitutional:      Appearance: She is well-developed.  HENT:     Head: Normocephalic and atraumatic.  Cardiovascular:     Rate and Rhythm: Normal rate and regular rhythm.  Pulmonary:     Effort: Pulmonary effort is normal. No respiratory distress.     Breath sounds: Normal breath sounds. No wheezing or rales.  Abdominal:     General: Bowel sounds are normal. There is no distension.     Palpations: Abdomen is soft.     Tenderness: There is no abdominal tenderness. There is no rebound.  Musculoskeletal:     Cervical back: Normal range of motion.  Skin:    General: Skin is warm and dry.  Neurological:     Mental Status: She is alert and oriented to person, place, and time.     Coordination: Coordination normal.     Vitals:   03/24/22 0838 03/24/22 0843  BP: (!) 160/80 (!) 140/80  Pulse: 69   Temp: 97.7 F (36.5 C)   TempSrc: Oral   SpO2: 93%   Weight: 201 lb (91.2 kg)   Height: '5\' 2"'$  (1.575 m)     Assessment & Plan:

## 2022-03-25 ENCOUNTER — Telehealth: Payer: Self-pay

## 2022-03-25 NOTE — Telephone Encounter (Signed)
PA started for Ubrelvy.   Key: [id number]

## 2022-03-30 IMAGING — CT CT CERVICAL SPINE W/O CM
4 series · 15 of 33 positions shown, 18 images · non-contrast
Comparison: None

CLINICAL DATA: Jumped out of window

EXAM:
CT HEAD WITHOUT CONTRAST
TECHNIQUE: Contiguous axial images were obtained from the base of the skull
through the vertex without intravenous contrast.

[Series 4: c spine soft · axial · 0.34mm/px · z∈[+1036,+1068]mm · 2 of 95 slices shown]
[im 16/95  soft-tissue]
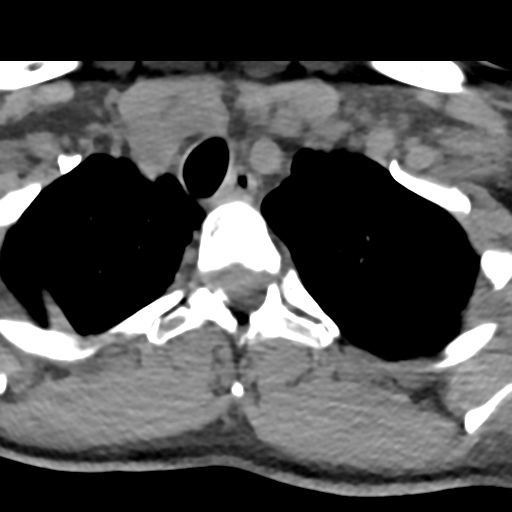
[im 32/95  soft-tissue]
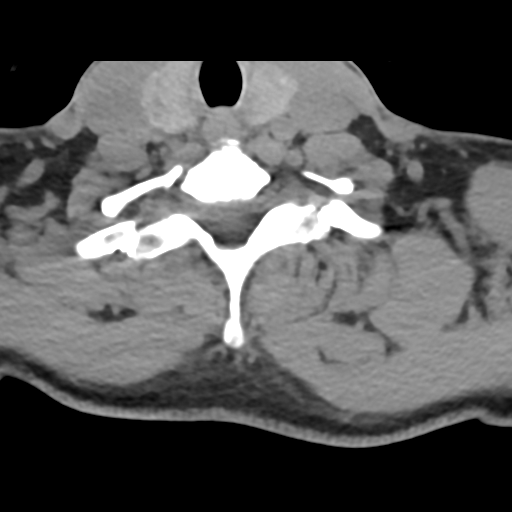

[Series 8: sag bone · sagittal · 0.34mm/px · 5 of 87 slices shown, 6 images]
[im 29/87  bone]
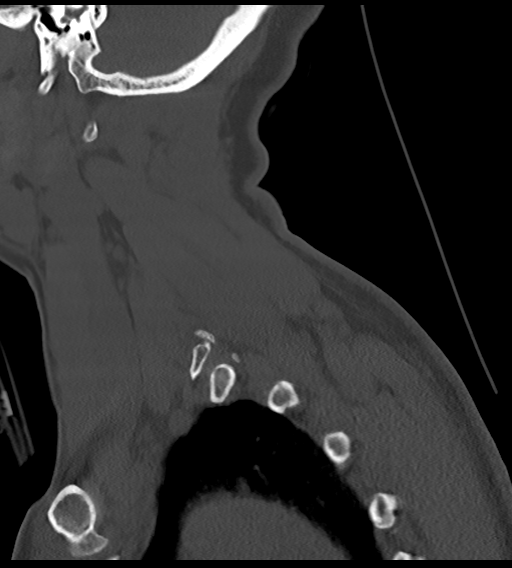
[im 36/87  bone]
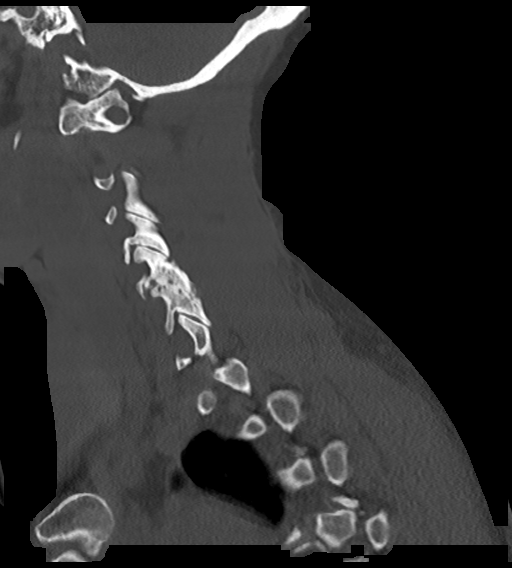
[im 44/87  soft-tissue]
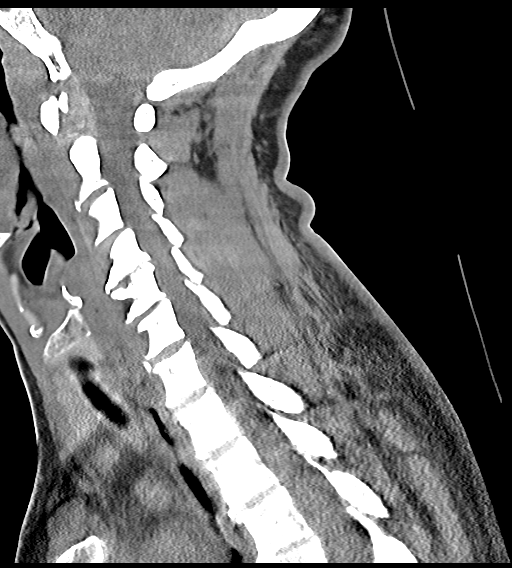
[im 44/87  bone]
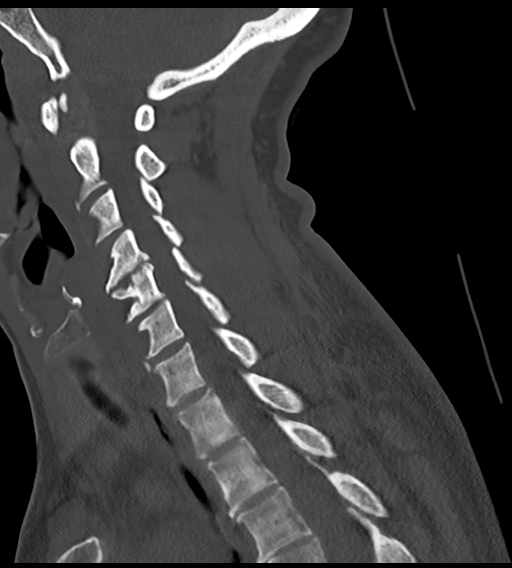
[im 51/87  bone]
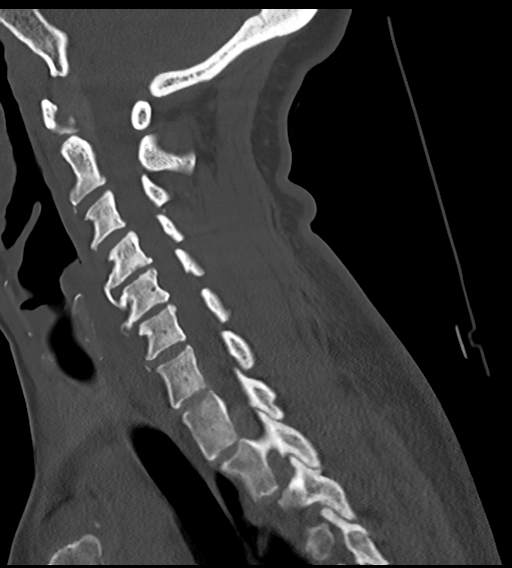
[im 58/87  bone]
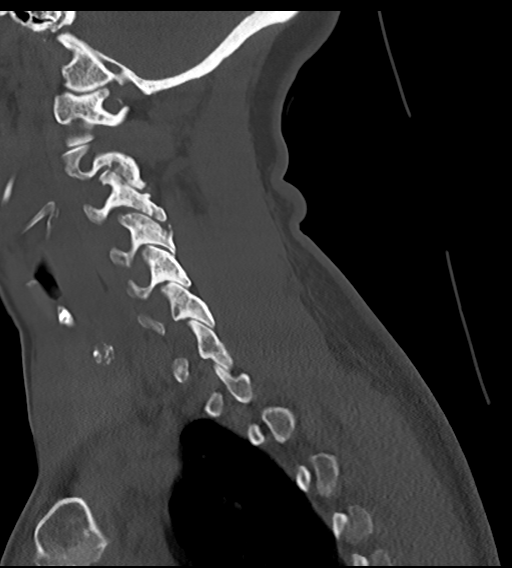

[Series 9: cor bone · coronal · 0.34mm/px · 3 of 85 slices shown]
[im 17/85  bone]
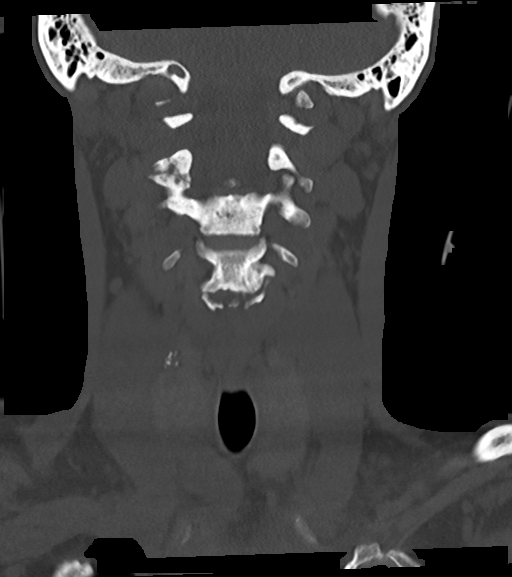
[im 34/85  bone]
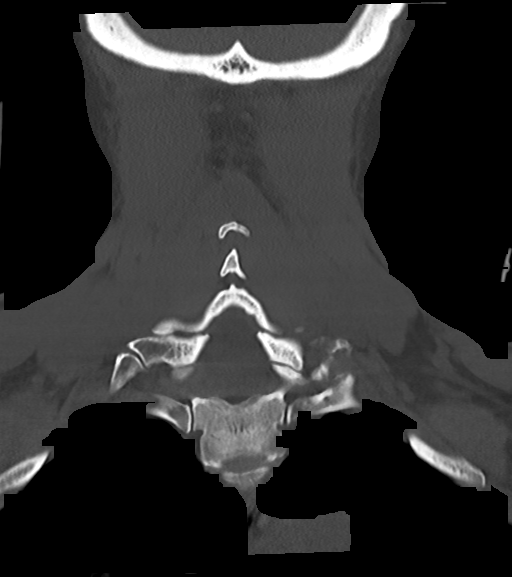
[im 51/85  bone]
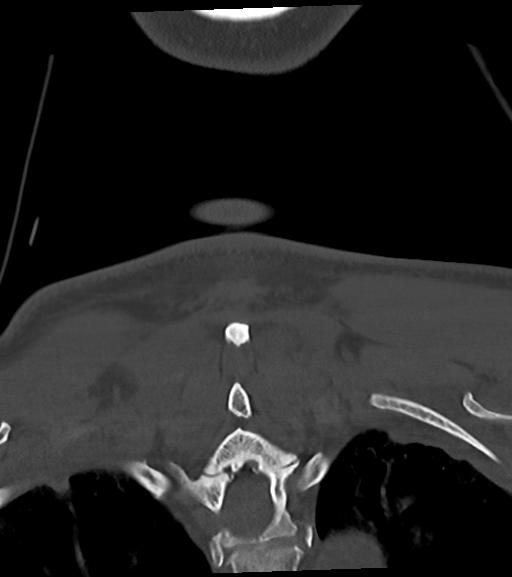

[Series 10: orthogonal axials · axial · 0.21mm/px · z∈[+1028,+1132]mm · 5 of 98 slices shown, 7 images]
[im 17/98  soft-tissue]
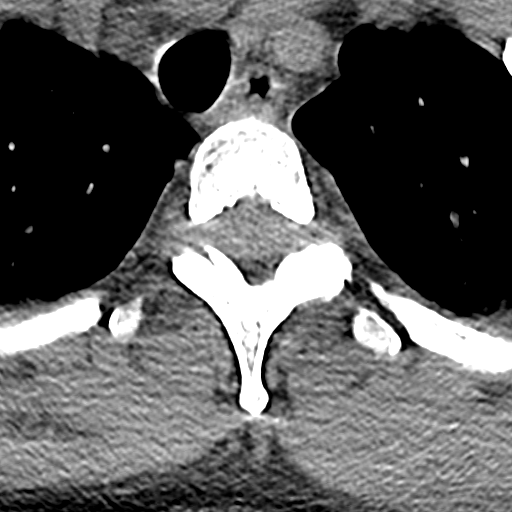
[im 17/98  bone]
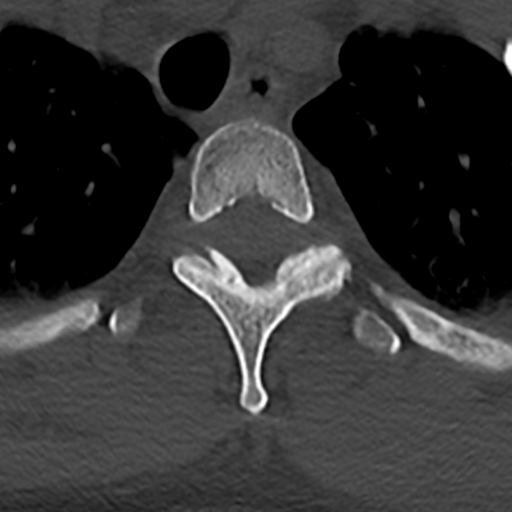
[im 33/98  bone]
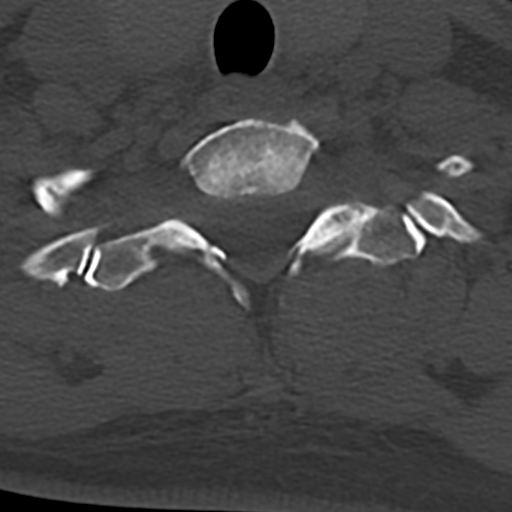
[im 49/98  bone]
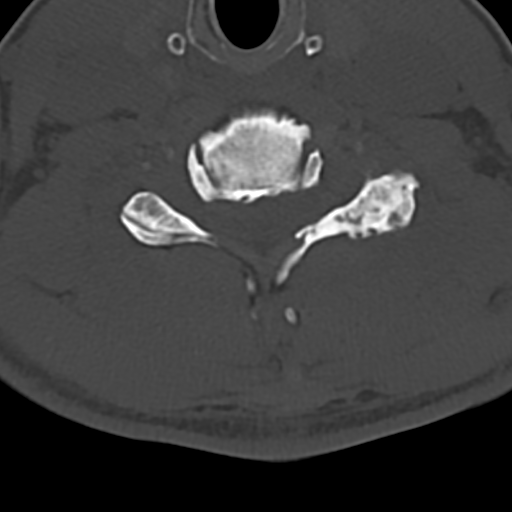
[im 65/98  bone]
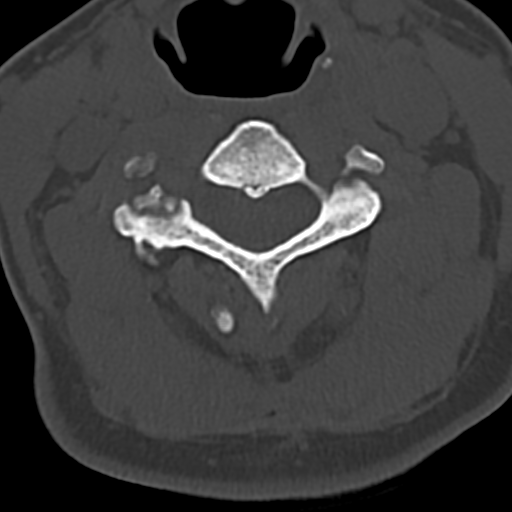
[im 81/98  soft-tissue]
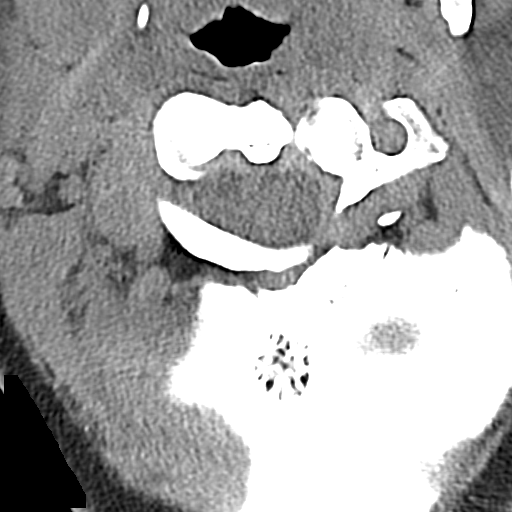
[im 81/98  bone]
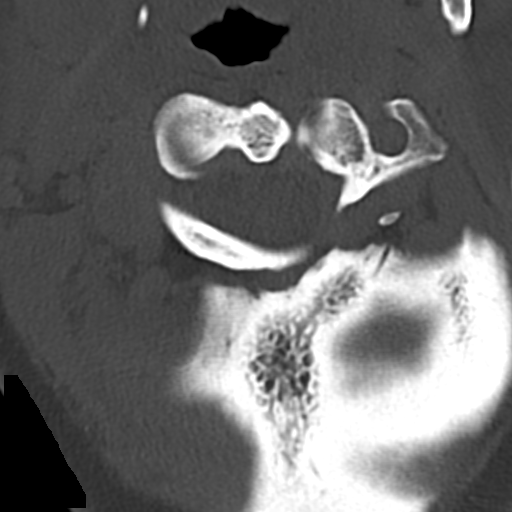

[15 of 33 positions shown; findings below may reference images not displayed]

FINDINGS: Brain: No evidence of acute territorial infarction, hemorrhage,
hydrocephalus,extra-axial collection or mass lesion/mass effect.
Normal gray-white differentiation. Ventricles are normal in size and
contour.

Vascular: No hyperdense vessel or unexpected calcification.

Skull: The skull is intact. No fracture or focal lesion identified.

Sinuses/Orbits: The visualized paranasal sinuses and mastoid air
cells are clear. The orbits and globes intact.

Other: None

Cervical spine:

Alignment: Physiologic

Skull base and vertebrae: Visualized skull base is intact. No
atlanto-occipital dissociation. The vertebral body heights are well
maintained. No fracture or pathologic osseous lesion seen.

Soft tissues and spinal canal: The visualized paraspinal soft
tissues are unremarkable. No prevertebral soft tissue swelling is
seen. The spinal canal is grossly unremarkable, no large epidural
collection or significant canal narrowing.

Disc levels: Mild disc height loss with anterior osteophytes and
disc osteophyte complexes most notable at C4-C5 with mild neural
foraminal narrowing and mild central canal stenosis.

Upper chest: The lung apices are clear. Thoracic inlet is within
normal limits.

Other: None
IMPRESSION: No acute intracranial abnormality.

No acute fracture or malalignment of the spine.

## 2022-04-09 ENCOUNTER — Telehealth: Payer: Self-pay | Admitting: Internal Medicine

## 2022-04-09 MED ORDER — SUMATRIPTAN SUCCINATE 50 MG PO TABS: 50.0000 mg | ORAL_TABLET | ORAL | 0 refills | Status: DC | PRN

## 2022-04-09 NOTE — Telephone Encounter (Signed)
Upon looking on Cover My meds I was able to see that the PA has been denied.

## 2022-04-09 NOTE — Telephone Encounter (Signed)
Have sent in sumatriptan to try for headaches.

## 2022-04-09 NOTE — Telephone Encounter (Signed)
Patient called to check on the prior authorization for her Roselyn Meier - she has not been able to get it refilled.  Patient would like to come in and get a shot but we do not have any visits with Dr. Sharlet Salina today.  Please call patient back and advise.  Patient's phone:  [phone]

## 2022-04-12 NOTE — Telephone Encounter (Signed)
Called patient and left a voicemail letting her know Dr Sharlet Salina has sent in a prescription for her

## 2022-07-09 ENCOUNTER — Other Ambulatory Visit: Payer: Self-pay | Admitting: Internal Medicine

## 2022-09-07 ENCOUNTER — Ambulatory Visit: Payer: Managed Care, Other (non HMO) | Admitting: Internal Medicine

## 2022-09-07 ENCOUNTER — Encounter: Payer: Self-pay | Admitting: Internal Medicine

## 2022-09-07 VITALS — BP 120/86 | HR 74 | Temp 98.4°F | Ht 62.0 in | Wt 202.0 lb

## 2022-09-07 DIAGNOSIS — M722 Plantar fascial fibromatosis: Secondary | ICD-10-CM | POA: Insufficient documentation

## 2022-09-07 DIAGNOSIS — R053 Chronic cough: Secondary | ICD-10-CM | POA: Diagnosis not present

## 2022-09-07 MED ORDER — PROMETHAZINE-DM 6.25-15 MG/5ML PO SYRP: 5.0000 mL | ORAL_SOLUTION | Freq: Four times a day (QID) | ORAL | 0 refills | Status: DC | PRN

## 2022-09-07 NOTE — Assessment & Plan Note (Signed)
Having cough for 3 weeks and suspect allergic in nature. Rx promethazine/dm cough syrup and advised to start zyrtec otc. If no improvement 1-2 weeks let us know. Discussed GERD as another potential cause.

## 2022-09-07 NOTE — Assessment & Plan Note (Signed)
Right foot and counseled about exercise and shoeware. Given information about exercises. If no improvement discussed other options she will let us know.

## 2022-09-07 NOTE — Progress Notes (Signed)
   Subjective:   Patient ID: Aimee Anderson, female    DOB: 1965/11/12, 57 y.o.   MRN: [id number]  HPI The patient is a 57 YO female coming in for cough about 3 weeks. Has not taken anything for this. Also right heel pain.  Review of Systems  Constitutional: Negative.   HENT:  Positive for congestion.   Eyes: Negative.   Respiratory:  Positive for cough. Negative for chest tightness and shortness of breath.   Cardiovascular:  Negative for chest pain, palpitations and leg swelling.  Gastrointestinal:  Negative for abdominal distention, abdominal pain, constipation, diarrhea, nausea and vomiting.  Musculoskeletal:  Positive for arthralgias and myalgias.  Skin: Negative.   Neurological: Negative.   Psychiatric/Behavioral: Negative.      Objective:  Physical Exam Constitutional:      Appearance: She is well-developed.  HENT:     Head: Normocephalic and atraumatic.  Cardiovascular:     Rate and Rhythm: Normal rate and regular rhythm.  Pulmonary:     Effort: Pulmonary effort is normal. No respiratory distress.     Breath sounds: Normal breath sounds. No wheezing or rales.  Abdominal:     General: Bowel sounds are normal. There is no distension.     Palpations: Abdomen is soft.     Tenderness: There is no abdominal tenderness. There is no rebound.  Musculoskeletal:        General: Tenderness present.     Cervical back: Normal range of motion.  Skin:    General: Skin is warm and dry.  Neurological:     Mental Status: She is alert and oriented to person, place, and time.     Coordination: Coordination normal.     Vitals:   09/07/22 0928  BP: 120/86  Pulse: 74  Temp: 98.4 F (36.9 C)  TempSrc: Oral  SpO2: 97%  Weight: 202 lb (91.6 kg)  Height: 5\' 2"  (1.575 m)    Assessment & Plan:  Visit time 20 minutes in face to face communication with patient and coordination of care, additional 10 minutes spent in record review, coordination or care, ordering tests,  communicating/referring to other healthcare professionals, documenting in medical records all on the same day of the visit for total time 30 minutes spent on the visit.

## 2022-09-07 NOTE — Patient Instructions (Signed)
Cetirizine (zyrtec) take daily for 1-2 weeks to help.  We have sent in a cough medicine to use in the meantime.

## 2022-10-07 ENCOUNTER — Encounter: Payer: Self-pay | Admitting: Internal Medicine

## 2022-10-12 ENCOUNTER — Other Ambulatory Visit: Payer: Self-pay | Admitting: Internal Medicine

## 2022-10-13 ENCOUNTER — Telehealth: Payer: Self-pay | Admitting: Internal Medicine

## 2022-10-13 NOTE — Telephone Encounter (Signed)
Ok to refill 

## 2022-10-13 NOTE — Telephone Encounter (Signed)
Prescription Request  10/13/2022  LOV: 09/07/2022  What is the name of the medication or equipment? SUMAtriptan (IMITREX) 50 MG tablet   Have you contacted your pharmacy to request a refill? No   Which pharmacy would you like this sent to?  [id number] - [place], [place] - [address] [place] [address] Phone: [phone] Fax: [phone]    Patient notified that their request is being sent to the clinical staff for review and that they should receive a response within 2 business days.   Please advise at Mobile [phone] (mobile)

## 2022-10-14 ENCOUNTER — Other Ambulatory Visit: Payer: Self-pay | Admitting: Internal Medicine

## 2022-10-14 ENCOUNTER — Other Ambulatory Visit: Payer: Self-pay

## 2022-10-14 DIAGNOSIS — Z1231 Encounter for screening mammogram for malignant neoplasm of breast: Secondary | ICD-10-CM

## 2022-10-14 MED ORDER — SUMATRIPTAN SUCCINATE 50 MG PO TABS: ORAL_TABLET | ORAL | 0 refills | Status: DC

## 2022-10-14 NOTE — Telephone Encounter (Signed)
Ok for #10 no refills

## 2022-10-14 NOTE — Telephone Encounter (Signed)
Refill sent in

## 2022-10-18 ENCOUNTER — Encounter: Payer: Self-pay | Admitting: Internal Medicine

## 2022-10-18 ENCOUNTER — Ambulatory Visit (INDEPENDENT_AMBULATORY_CARE_PROVIDER_SITE_OTHER): Payer: Managed Care, Other (non HMO) | Admitting: Internal Medicine

## 2022-10-18 ENCOUNTER — Ambulatory Visit
Admission: RE | Admit: 2022-10-18 | Discharge: 2022-10-18 | Disposition: A | Discharge status: Home / Self Care | Payer: Managed Care, Other (non HMO) | Source: Ambulatory Visit | Attending: Internal Medicine | Admitting: Internal Medicine

## 2022-10-18 VITALS — BP 120/100 | HR 74 | Temp 98.3°F | Ht 62.0 in | Wt 203.0 lb

## 2022-10-18 DIAGNOSIS — Z0000 Encounter for general adult medical examination without abnormal findings: Secondary | ICD-10-CM

## 2022-10-18 DIAGNOSIS — Z1159 Encounter for screening for other viral diseases: Secondary | ICD-10-CM

## 2022-10-18 DIAGNOSIS — Z1231 Encounter for screening mammogram for malignant neoplasm of breast: Secondary | ICD-10-CM

## 2022-10-18 DIAGNOSIS — Z13220 Encounter for screening for lipoid disorders: Secondary | ICD-10-CM | POA: Diagnosis not present

## 2022-10-18 DIAGNOSIS — I10 Essential (primary) hypertension: Secondary | ICD-10-CM | POA: Diagnosis not present

## 2022-10-18 DIAGNOSIS — G43019 Migraine without aura, intractable, without status migrainosus: Secondary | ICD-10-CM

## 2022-10-18 DIAGNOSIS — Z860100 Personal history of colon polyps, unspecified: Secondary | ICD-10-CM

## 2022-10-18 LAB — COMPREHENSIVE METABOLIC PANEL
ALT: 17 U/L (ref 0–35)
AST: 20 U/L (ref 0–37)
Albumin: 4 g/dL (ref 3.5–5.2)
Alkaline Phosphatase: 53 U/L (ref 39–117)
BUN: 14 mg/dL (ref 6–23)
CO2: 29 mEq/L (ref 19–32)
Calcium: 9.6 mg/dL (ref 8.4–10.5)
Chloride: 105 mEq/L (ref 96–112)
Creatinine, Ser: 0.97 mg/dL (ref 0.40–1.20)
GFR: 64.2 mL/min (ref >60.00)
Glucose, Bld: 102 mg/dL — ABNORMAL HIGH (ref 70–99)
Potassium: 3.9 mEq/L (ref 3.5–5.1)
Sodium: 140 mEq/L (ref 135–145)
Total Bilirubin: 0.9 mg/dL (ref 0.2–1.2)
Total Protein: 7.1 g/dL (ref 6.0–8.3)

## 2022-10-18 LAB — CBC
HCT: 38.6 % (ref 36.0–46.0)
Hemoglobin: 13 g/dL (ref 12.0–15.0)
MCHC: 33.7 g/dL (ref 30.0–36.0)
MCV: 84.3 fl (ref 78.0–100.0)
Platelets: 215 10*3/uL (ref 150.0–400.0)
RBC: 4.58 Mil/uL (ref 3.87–5.11)
RDW: 14.2 % (ref 11.5–15.5)
WBC: 5.6 10*3/uL (ref 4.0–10.5)

## 2022-10-18 LAB — LIPID PANEL
Cholesterol: 183 mg/dL (ref 0–200)
HDL: 61.8 mg/dL (ref >39.00)
LDL Cholesterol: 111 mg/dL — ABNORMAL HIGH (ref 0–99)
NonHDL: 120.77
Total CHOL/HDL Ratio: 3
Triglycerides: 51 mg/dL (ref 0.0–149.0)
VLDL: 10.2 mg/dL (ref 0.0–40.0)

## 2022-10-18 MED ORDER — LOSARTAN POTASSIUM-HCTZ 50-12.5 MG PO TABS: 1.0000 | ORAL_TABLET | Freq: Every day | ORAL | 3 refills | Status: DC

## 2022-10-18 MED ORDER — SUMATRIPTAN SUCCINATE 50 MG PO TABS: 50.0000 mg | ORAL_TABLET | ORAL | 5 refills | Status: DC | PRN

## 2022-10-18 NOTE — Assessment & Plan Note (Signed)
BP elevated today but this is atypical for her. Prior visits this was normal. She will continue on losartan/hydrochlorothiazide 50/12.5 mg daily and let us know if BP elevated at home. Checking CMP and CBC and lipid panel today.

## 2022-10-18 NOTE — Assessment & Plan Note (Signed)
Flu shot yearly. Shingrix complete. Tetanus up to date. Colonoscopy due referral to GI placed. Mammogram up to date, pap smear up to date. Counseled about sun safety and mole surveillance. Counseled about the dangers of distracted driving. Given 10 year screening recommendations.

## 2022-10-18 NOTE — Assessment & Plan Note (Signed)
Refilled imitrex 50 mg prn migraines which she has not needed as much lately. Gets 1 migraine per month max 3 days of symptoms.

## 2022-10-18 NOTE — Progress Notes (Signed)
   Subjective:   Patient ID: Aimee Anderson, female    DOB: 1965/11/12, 57 y.o.   MRN: [id number]  HPI The patient is here for physical.  PMH, [facility], social history reviewed and updated  Review of Systems  Constitutional: Negative.   HENT: Negative.    Eyes: Negative.   Respiratory:  Negative for cough, chest tightness and shortness of breath.   Cardiovascular:  Negative for chest pain, palpitations and leg swelling.  Gastrointestinal:  Negative for abdominal distention, abdominal pain, constipation, diarrhea, nausea and vomiting.  Musculoskeletal: Negative.   Skin: Negative.   Neurological: Negative.   Psychiatric/Behavioral: Negative.      Objective:  Physical Exam Constitutional:      Appearance: She is well-developed.  HENT:     Head: Normocephalic and atraumatic.  Cardiovascular:     Rate and Rhythm: Normal rate and regular rhythm.  Pulmonary:     Effort: Pulmonary effort is normal. No respiratory distress.     Breath sounds: Normal breath sounds. No wheezing or rales.  Abdominal:     General: Bowel sounds are normal. There is no distension.     Palpations: Abdomen is soft.     Tenderness: There is no abdominal tenderness. There is no rebound.  Musculoskeletal:     Cervical back: Normal range of motion.  Skin:    General: Skin is warm and dry.  Neurological:     Mental Status: She is alert and oriented to person, place, and time.     Coordination: Coordination normal.     Vitals:   10/18/22 0928 10/18/22 0930  BP: (!) 120/100 (!) 120/100  Pulse: 74   Temp: 98.3 F (36.8 C)   TempSrc: Oral   SpO2: 95%   Weight: 203 lb (92.1 kg)   Height: 5\' 2"  (1.575 m)     Assessment & Plan:

## 2022-10-19 LAB — HEPATITIS C ANTIBODY: Hepatitis C Ab: NONREACTIVE

## 2022-10-19 LAB — HIV ANTIBODY (ROUTINE TESTING W REFLEX): HIV 1&2 Ab, 4th Generation: NONREACTIVE

## 2022-10-20 LAB — HM MAMMOGRAPHY

## 2022-10-21 ENCOUNTER — Encounter: Payer: Self-pay | Admitting: Internal Medicine

## 2022-11-13 ENCOUNTER — Emergency Department (HOSPITAL_BASED_OUTPATIENT_CLINIC_OR_DEPARTMENT_OTHER): Payer: Managed Care, Other (non HMO) | Admitting: Radiology

## 2022-11-13 ENCOUNTER — Encounter (HOSPITAL_BASED_OUTPATIENT_CLINIC_OR_DEPARTMENT_OTHER): Payer: Self-pay

## 2022-11-13 ENCOUNTER — Emergency Department (HOSPITAL_BASED_OUTPATIENT_CLINIC_OR_DEPARTMENT_OTHER)
Admission: EM | Admit: 2022-11-13 | Discharge: 2022-11-13 | Disposition: A | Discharge status: Home / Self Care | Payer: Managed Care, Other (non HMO) | Attending: Emergency Medicine | Admitting: Emergency Medicine

## 2022-11-13 DIAGNOSIS — M7918 Myalgia, other site: Secondary | ICD-10-CM

## 2022-11-13 DIAGNOSIS — Z79899 Other long term (current) drug therapy: Secondary | ICD-10-CM | POA: Diagnosis not present

## 2022-11-13 DIAGNOSIS — R0789 Other chest pain: Secondary | ICD-10-CM | POA: Insufficient documentation

## 2022-11-13 DIAGNOSIS — M791 Myalgia, unspecified site: Secondary | ICD-10-CM | POA: Insufficient documentation

## 2022-11-13 DIAGNOSIS — I10 Essential (primary) hypertension: Secondary | ICD-10-CM | POA: Diagnosis not present

## 2022-11-13 MED ORDER — METHOCARBAMOL 500 MG PO TABS: 500.0000 mg | ORAL_TABLET | Freq: Three times a day (TID) | ORAL | 0 refills | Status: DC | PRN

## 2022-11-13 NOTE — ED Notes (Signed)
Dc instructions reviewed with patient. Patient voiced understanding. Dc with belongings.  °

## 2022-11-13 NOTE — ED Triage Notes (Signed)
Onset yesterday of left shoulder pain.  Very painful to move left arm.  Having tinging to left hand Denies injury

## 2022-11-13 NOTE — ED Triage Notes (Signed)
States work out on Tuesday with weight.

## 2022-11-13 NOTE — ED Provider Notes (Signed)
[place] [facility] DEPARTMENT AT [facility] Provider Note   CSN: [id number] Arrival date & time: 11/13/22  1715     History  Chief Complaint  Patient presents with   Shoulder Pain    Aimee Anderson is a 57 y.o. female.   Shoulder Pain Patient presents with right-sided chest and back pain.  Began yesterday.  Worse with movement of the arm.  Worse with certain palpations of the back.  No cough or difficulty breathing.  No fevers.  Did do some working out but states it was no different than normal.  No rash.  Does not hurt much just sitting there.    Past Medical History:  Diagnosis Date   Allergy    ALLERGY, HX OF 06/22/2007   Anemia    History   Arthritis    Depression    FLANK PAIN, RIGHT 04/15/2009   HYPERTENSION 06/22/2007   Hypertension    Sickle cell anemia (HCC)    Sickle cell Trait   Sickle cell trait (HCC)    SVD (spontaneous vaginal delivery)    x 1   Uterine fibroid     Home Medications Prior to Admission medications   Medication Sig Start Date End Date Taking? Authorizing Provider  methocarbamol (ROBAXIN) 500 MG tablet Take 1 tablet (500 mg total) by mouth every 8 (eight) hours as needed. 11/13/22  Yes Benjiman Core, MD  ELDERBERRY PO Take by mouth.    [provider]  ibuprofen (ADVIL) 600 MG tablet Take 1 tablet (600 mg total) by mouth every 8 (eight) hours as needed. 02/24/22   Henson, Vickie L, NP-C  losartan-hydrochlorothiazide (HYZAAR) 50-12.5 MG tablet Take 1 tablet by mouth daily. 10/18/22   Myrlene Broker, MD  Multiple Vitamin (MULTIVITAMIN) tablet Take 1 tablet by mouth daily.    [provider]  Omega-3 1000 MG CAPS Take by mouth.    [provider]  OVER THE COUNTER MEDICATION Vitamin C, 3,000 mg daily.    [provider]  OVER THE COUNTER MEDICATION Vitamin B Complex, one tablet daily.    [provider]  OVER THE COUNTER MEDICATION Calcium 600 mg, Once a day.    [provider]  promethazine-dextromethorphan (PROMETHAZINE-DM) 6.25-15 MG/5ML syrup Take 5 mLs by mouth 4 (four) times daily as needed. 09/07/22   Myrlene Broker, MD  SUMAtriptan (IMITREX) 50 MG tablet Take 1 tablet (50 mg total) by mouth every 2 (two) hours as needed for migraine. 10/18/22   Myrlene Broker, MD      Allergies    Kiwi extract, Penicillins, and Penicillins    Review of Systems   Review of Systems  Physical Exam Updated Vital Signs BP (!) 145/97 (BP Location: Right Arm)   Pulse 94   Temp 98.7 F (37.1 C)   Resp 20   Ht 5\' 2"  (1.575 m)   Wt 90.7 kg   LMP 05/10/2017 (Exact Date)   SpO2 100%   BMI 36.58 kg/m  Physical Exam Vitals and nursing note reviewed.  Pulmonary:     Comments: Some tenderness to left anterior upper chest wall.  No rash. Chest:     Chest wall: Tenderness present.  Abdominal:     Tenderness: There is no abdominal tenderness.  Musculoskeletal:        General: Tenderness present.     Comments: Tenderness over left posterior upper back.  No pain with passive movement of the arm but does have pain with active movement involving  musculature of the upper back.  Neurological:     Mental Status: She is alert and oriented to person, place, and time.     ED Results / Procedures / Treatments   Labs (all labs ordered are listed, but only abnormal results are displayed) Labs Reviewed - No data to display  EKG EKG Interpretation Date/Time:  Saturday November 13 2022 17:23:05 EDT Ventricular Rate:  90 PR Interval:  230 QRS Duration:  102 QT Interval:  348 QTC Calculation: 425 R Axis:   127  Text Interpretation: Sinus rhythm with 1st degree A-V block Right axis deviation Low voltage QRS Cannot rule out Anteroseptal infarct , age undetermined When compared with ECG of 24-Sep-2020 12:21, No significant change since last tracing Confirmed by Benjiman Core [phone]) on 11/13/2022 5:48:09 PM  Radiology DG Chest 2 View  Result Date:  11/13/2022 CLINICAL DATA:  Left upper chest and back pain since yesterday EXAM: CHEST - 2 VIEW COMPARISON:  None Available. FINDINGS: Cardiomegaly. Both lungs are clear. Disc degenerative disease of the thoracic spine. IMPRESSION: Cardiomegaly without acute abnormality of the lungs. Electronically Signed   By: Jearld Lesch M.D.   On: 11/13/2022 18:16    Procedures Procedures    Medications Ordered in ED Medications - No data to display  ED Course/ Medical Decision Making/ A&P                             Medical Decision Making Amount and/or Complexity of Data Reviewed Radiology: ordered.  Risk Prescription drug management.   Patient with left-sided chest/back pain.  Reproducible.  I think most likely musculoskeletal.  Differential diagnosis does include in thoracic causes such as pneumonia however.  X-ray reassuring.  EKG reassuring.  Pain worse with active movement of those muscles.  Will give muscle relaxers and also take symptomatic treatment home.  Appears stable for discharge home with outpatient follow-up.        Final Clinical Impression(s) / ED Diagnoses Final diagnoses:  Musculoskeletal pain    Rx / DC Orders ED Discharge Orders          Ordered    methocarbamol (ROBAXIN) 500 MG tablet  Every 8 hours PRN        11/13/22 1857              Benjiman Core, MD 11/13/22 1903

## 2022-11-18 ENCOUNTER — Encounter: Payer: Self-pay | Admitting: Internal Medicine

## 2022-11-18 ENCOUNTER — Ambulatory Visit (AMBULATORY_SURGERY_CENTER): Payer: Managed Care, Other (non HMO)

## 2022-11-18 VITALS — Ht 62.0 in | Wt 202.0 lb

## 2022-11-18 DIAGNOSIS — Z800 Family history of malignant neoplasm of digestive organs: Secondary | ICD-10-CM

## 2022-11-18 MED ORDER — NA SULFATE-K SULFATE-MG SULF 17.5-3.13-1.6 GM/177ML PO SOLN
1.0000 | Freq: Once | ORAL | 0 refills | Status: AC
Start: 2022-11-18 — End: 2022-11-18

## 2022-11-18 NOTE — Progress Notes (Signed)

## 2022-11-22 ENCOUNTER — Telehealth: Payer: Self-pay

## 2022-11-24 NOTE — Telephone Encounter (Signed)
error 

## 2022-12-09 ENCOUNTER — Ambulatory Visit: Payer: Managed Care, Other (non HMO) | Admitting: Internal Medicine

## 2022-12-09 ENCOUNTER — Encounter: Payer: Self-pay | Admitting: Internal Medicine

## 2022-12-09 VITALS — BP 132/83 | HR 61 | Temp 97.5°F | Resp 14 | Ht 62.0 in | Wt 202.0 lb

## 2022-12-09 DIAGNOSIS — Z800 Family history of malignant neoplasm of digestive organs: Secondary | ICD-10-CM

## 2022-12-09 DIAGNOSIS — Z1211 Encounter for screening for malignant neoplasm of colon: Secondary | ICD-10-CM

## 2022-12-09 MED ORDER — SODIUM CHLORIDE 0.9 % IV SOLN: 500.0000 mL | Freq: Once | INTRAVENOUS | Status: DC

## 2022-12-09 NOTE — Patient Instructions (Signed)
Please read handouts provided. Continue present medications. Repeat colonoscopy in 5 years for screening.   YOU HAD AN ENDOSCOPIC PROCEDURE TODAY AT THE [facility]:   Refer to the procedure report that was given to you for any specific questions about what was found during the examination.  If the procedure report does not answer your questions, please call your gastroenterologist to clarify.  If you requested that your care partner not be given the details of your procedure findings, then the procedure report has been included in a sealed envelope for you to review at your convenience later.  YOU SHOULD EXPECT: Some feelings of bloating in the abdomen. Passage of more gas than usual.  Walking can help get rid of the air that was put into your GI tract during the procedure and reduce the bloating. If you had a lower endoscopy (such as a colonoscopy or flexible sigmoidoscopy) you may notice spotting of blood in your stool or on the toilet paper. If you underwent a bowel prep for your procedure, you may not have a normal bowel movement for a few days.  Please Note:  You might notice some irritation and congestion in your nose or some drainage.  This is from the oxygen used during your procedure.  There is no need for concern and it should clear up in a day or so.  SYMPTOMS TO REPORT IMMEDIATELY:  Following lower endoscopy (colonoscopy or flexible sigmoidoscopy):  Excessive amounts of blood in the stool  Significant tenderness or worsening of abdominal pains  Swelling of the abdomen that is new, acute  Fever of 100F or higher  For urgent or emergent issues, a gastroenterologist can be reached at any hour by calling [phone]. Do not use MyChart messaging for urgent concerns.    DIET:  We do recommend a small meal at first, but then you may proceed to your regular diet.  Drink plenty of fluids but you should avoid alcoholic beverages for 24 hours.  ACTIVITY:  You should plan  to take it easy for the rest of today and you should NOT DRIVE or use heavy machinery until tomorrow (because of the sedation medicines used during the test).    FOLLOW UP: Our staff will call the number listed on your records the next business day following your procedure.  We will call around 7:15- 8:00 am to check on you and address any questions or concerns that you may have regarding the information given to you following your procedure. If we do not reach you, we will leave a message.     If any biopsies were taken you will be contacted by phone or by letter within the next 1-3 weeks.  Please call us at [phone] if you have not heard about the biopsies in 3 weeks.    SIGNATURES/CONFIDENTIALITY: You and/or your care partner have signed paperwork which will be entered into your electronic medical record.  These signatures attest to the fact that that the information above on your After Visit Summary has been reviewed and is understood.  Full responsibility of the confidentiality of this discharge information lies with you and/or your care-partner. 

## 2022-12-09 NOTE — Progress Notes (Signed)
HISTORY OF PRESENT ILLNESS:  Aimee Anderson is a 57 y.o. female with a family history of colon cancer.  Now for screening colonoscopy  REVIEW OF SYSTEMS:  All non-GI ROS negative except for  Past Medical History:  Diagnosis Date   Allergy    ALLERGY, HX OF 06/22/2007   Anemia    History   Arthritis    Depression    FLANK PAIN, RIGHT 04/15/2009   HYPERTENSION 06/22/2007   Hypertension    Sickle cell anemia (HCC)    Sickle cell Trait   Sickle cell trait (HCC)    SVD (spontaneous vaginal delivery)    x 1   Uterine fibroid     Past Surgical History:  Procedure Laterality Date   CESAREAN SECTION     x 1   COLONOSCOPY     DILATATION & CURETTAGE/HYSTEROSCOPY WITH MYOSURE N/A 01/21/2016   Procedure: DILATATION & CURETTAGE/HYSTEROSCOPY WITH MYOSURE;  Surgeon: Maxie Better, MD;  Location: [facility];  Service: Gynecology;  Laterality: N/A;   TONSILLECTOMY     TUBAL LIGATION      Social History Aimee Anderson  reports that she has never smoked. She has never used smokeless tobacco. She reports current alcohol use. She reports that she does not use drugs.  family history includes Bladder Cancer in her father; Breast cancer in her maternal grandmother; Colon cancer in her brother; Diabetes in her father; Gout in her father; Heart disease in her father; Hypertension in her mother; Ulcers in her father.  Allergies  Allergen Reactions   Kiwi Extract Anaphylaxis   Penicillins    Penicillins Hives    Has patient had a PCN reaction causing immediate rash, facial/tongue/throat swelling, SOB or lightheadedness with hypotension: Yes Has patient had a PCN reaction causing severe rash involving mucus membranes or skin necrosis: Yes Has patient had a PCN reaction that required hospitalization No Has patient had a PCN reaction occurring within the last 10 years: No If all of the above answers are "NO", then may proceed with Cephalosporin use.        PHYSICAL EXAMINATION: Vital  signs: BP (!) 137/91   Pulse 64   Temp (!) 97.5 F (36.4 C) (Temporal)   Resp 14   Ht 5\' 2"  (1.575 m)   Wt 202 lb (91.6 kg)   LMP 05/10/2017 (Exact Date)   SpO2 99%   BMI 36.95 kg/m  General: Well-developed, well-nourished, no acute distress HEENT: Sclerae are anicteric, conjunctiva pink. Oral mucosa intact Lungs: Clear Heart: Regular Abdomen: soft, nontender, nondistended, no obvious ascites, no peritoneal signs, normal bowel sounds. No organomegaly. Extremities: No edema Psychiatric: alert and oriented x3. Cooperative     ASSESSMENT:  Brother with colon cancer less than age 28   PLAN:  Screening colonoscopy

## 2022-12-09 NOTE — Progress Notes (Signed)
Pt resting comfortably. VSS. Airway intact. SBAR complete to RN. All questions answered.   

## 2022-12-09 NOTE — Op Note (Signed)
[facility] Patient Name: Aimee Anderson Procedure Date: 12/09/2022 10:15 AM MRN: [id number] Endoscopist: Wilhemina Bonito. Marina Goodell , MD, [id number] Age: 57 Referring MD:  Date of Birth: 10-25-1965 Gender: Female Account #: [id number] Procedure:                Colonoscopy Indications:              Screening in patient at increased risk: Colorectal                            cancer in brother before age 61. Previous                            examinations 1998, 2004, 2014, 2019. All negative                            for neoplasia. Medicines:                Monitored Anesthesia Care Procedure:                Pre-Anesthesia Assessment:                           - Prior to the procedure, a History and Physical                            was performed, and patient medications and                            allergies were reviewed. The patient's tolerance of                            previous anesthesia was also reviewed. The risks                            and benefits of the procedure and the sedation                            options and risks were discussed with the patient.                            All questions were answered, and informed consent                            was obtained. Prior Anticoagulants: The patient has                            taken no anticoagulant or antiplatelet agents. ASA                            Grade Assessment: II - A patient with mild systemic                            disease. After reviewing the risks and benefits,  the patient was deemed in satisfactory condition to                            undergo the procedure.                           After obtaining informed consent, the colonoscope                            was passed under direct vision. Throughout the                            procedure, the patient's blood pressure, pulse, and                            oxygen saturations were monitored continuously. The                             CF [id number] #[id number] was introduced through the anus                            and advanced to the the cecum, identified by                            appendiceal orifice and ileocecal valve. The                            ileocecal valve, appendiceal orifice, and rectum                            were photographed. The quality of the bowel                            preparation was excellent. The colonoscopy was                            performed without difficulty. The patient tolerated                            the procedure well. The bowel preparation used was                            SUPREP via split dose instruction. Scope In: 10:29:04 AM Scope Out: 10:40:13 AM Scope Withdrawal Time: 0 hours 8 minutes 25 seconds  Total Procedure Duration: 0 hours 11 minutes 9 seconds  Findings:                 Multiple diverticula were found in the sigmoid                            colon and right colon.                           The exam was otherwise without abnormality on  direct and retroflexion views. Complications:            No immediate complications. Estimated blood loss:                            None. Estimated Blood Loss:     Estimated blood loss: none. Impression:               - Diverticulosis in the sigmoid colon and in the                            right colon.                           - The examination was otherwise normal on direct                            and retroflexion views.                           - No specimens collected. Recommendation:           - Repeat colonoscopy in 5 years for surveillance                            (family history).                           - Patient has a contact number available for                            emergencies. The signs and symptoms of potential                            delayed complications were discussed with the                            patient. Return to normal  activities tomorrow.                            Written discharge instructions were provided to the                            patient.                           - Resume previous diet.                           - Continue present medications. Wilhemina Bonito. Marina Goodell, MD 12/09/2022 10:48:05 AM This report has been signed electronically.

## 2022-12-10 ENCOUNTER — Telehealth: Payer: Self-pay

## 2022-12-10 NOTE — Telephone Encounter (Signed)
Attempted to reach patient for post-procedure f/u call. No answer. Left message for her to please not hesitate to call if she has any questions/concerns regarding her care. 

## 2022-12-10 NOTE — Telephone Encounter (Signed)
  Follow up Call-     12/09/2022    9:28 AM  Call back number  Post procedure Call Back phone  # [phone]  Permission to leave phone message Yes     Patient questions:  Do you have a fever, pain , or abdominal swelling? No. Pain Score  0 *  Have you tolerated food without any problems? Yes.    Have you been able to return to your normal activities? Yes.    Do you have any questions about your discharge instructions: Diet   No. Medications  No. Follow up visit  No.  Do you have questions or concerns about your Care? No.  Actions: * If pain score is 4 or above: No action needed, pain <4.

## 2023-05-19 ENCOUNTER — Ambulatory Visit: Payer: Self-pay | Admitting: Internal Medicine

## 2023-05-19 NOTE — Telephone Encounter (Signed)
   Chief Complaint: migraine Symptoms: severe headache, nausea, light sensitivity Frequency: x 1 week Pertinent Negatives: Patient denies fever, stiff neck Disposition: [] ED /[] Urgent Care (no appt availability in office) / [x] Appointment(In office/virtual)/ []  [place]/ [] Home Care/ [] Refused Recommended Disposition /[] [place] [facility]/ []  Follow-up with PCP Additional Notes: Patient reports she has had a migraine since last week. Patient reports hx of migraines but states her migraine medication is not helping. Per protocol, pt scheduled for appt 1/24 in office. Patient advised to call back with worsening symptoms. Patient verbalized understanding.     Copied from CRM [id number]. Topic: Clinical - Pink Word Triage >> May 19, 2023  2:43 PM Samuel Jester B wrote: Reason for Triage: Pt stated that she has been having terrible migrains and wanted to know if she could come in an get a shot. She has been dealing with nausea as well, eyes sentiive to light. Symtpoms have lasted 3 days but the medication she has been taking "SUMAtriptan (IMITREX) 50 MG tablet" has not been working Reason for Disposition  Headache is a chronic symptom (recurrent or ongoing AND present > 4 weeks)  Answer Assessment - Initial Assessment Questions 1. LOCATION: "Where does it hurt?"      Middle of head 2. ONSET: "When did the headache start?" (Minutes, hours or days)      Last week, usually come in clusters 3. PATTERN: "Does the pain come and go, or has it been constant since it started?"     Constant for a few days, then goes away and comes back 4. SEVERITY: "How bad is the pain?" and "What does it keep you from doing?"  (e.g., Scale 1-10; mild, moderate, or severe)   - MILD (1-3): doesn't interfere with normal activities    - MODERATE (4-7): interferes with normal activities or awakens from sleep    - SEVERE (8-10): excruciating pain, unable to do any normal activities        8/10 5. RECURRENT SYMPTOM:  "Have you ever had headaches before?" If Yes, ask: "When was the last time?" and "What happened that time?"      yes 6. CAUSE: "What do you think is causing the headache?"     I get migraines 7. MIGRAINE: "Have you been diagnosed with migraine headaches?" If Yes, ask: "Is this headache similar?"      yes 8. HEAD INJURY: "Has there been any recent injury to the head?"      none 9. OTHER SYMPTOMS: "Do you have any other symptoms?" (fever, stiff neck, eye pain, sore throat, cold symptoms)     Light sensitive, nausea  Protocols used: Headache-A-AH

## 2023-05-20 ENCOUNTER — Encounter: Payer: Self-pay | Admitting: Family Medicine

## 2023-05-20 ENCOUNTER — Ambulatory Visit: Payer: Managed Care, Other (non HMO) | Admitting: Family Medicine

## 2023-05-20 VITALS — BP 128/96 | HR 73 | Temp 98.4°F | Ht 62.0 in | Wt 201.6 lb

## 2023-05-20 DIAGNOSIS — G43019 Migraine without aura, intractable, without status migrainosus: Secondary | ICD-10-CM | POA: Diagnosis not present

## 2023-05-20 DIAGNOSIS — R110 Nausea: Secondary | ICD-10-CM | POA: Diagnosis not present

## 2023-05-20 DIAGNOSIS — N951 Menopausal and female climacteric states: Secondary | ICD-10-CM | POA: Diagnosis not present

## 2023-05-20 MED ORDER — KETOROLAC TROMETHAMINE 60 MG/2ML IM SOLN
60.0000 mg | Freq: Once | INTRAMUSCULAR | Status: AC
Start: 2023-05-20 — End: 2023-05-20
  Administered 2023-05-20: 60 mg via INTRAMUSCULAR

## 2023-05-20 MED ORDER — METHYLPREDNISOLONE ACETATE 80 MG/ML IJ SUSP
80.0000 mg | Freq: Once | INTRAMUSCULAR | Status: AC
Start: 2023-05-20 — End: 2023-05-20
  Administered 2023-05-20: 80 mg via INTRAMUSCULAR

## 2023-05-20 MED ORDER — ONDANSETRON 4 MG PO TBDP: 4.0000 mg | ORAL_TABLET | Freq: Three times a day (TID) | ORAL | 0 refills | Status: DC | PRN

## 2023-05-20 MED ORDER — RIZATRIPTAN BENZOATE 10 MG PO TABS: 10.0000 mg | ORAL_TABLET | ORAL | 0 refills | Status: DC | PRN

## 2023-05-20 NOTE — Patient Instructions (Addendum)
Me. No. Pause. OTC Black Cohosh, Ashwaganda  There is a medication we can try later if these are not effective called Veozah.  You have received a steroid injection in the office today.  You have received a toradol injection in the office for pain today.  I have sent in Maxalt, if this medication does not work for your migraines, have also given you a sample of Ubrelvy 50 mg.    I have also sent in Zofran for you to take as needed for nausea accompanying migraines.  Follow-up with me for new or worsening symptoms.

## 2023-05-20 NOTE — Progress Notes (Unsigned)
Acute Office Visit  Subjective:     Patient ID: Aimee Anderson, female    DOB: November 18, 1965, 58 y.o.   MRN: [id number]  Chief Complaint  Patient presents with   Migraine    Migraine symptoms for the past two weeks. Major migraine symptoms starting the beginning of this week. Patient notes getting treated with Toradol injection in the past which seemed to help. Also has prescription of SUMAtriptan which usually helps but has not alleviated this last set of symptoms    Migraine    58 year old female presents for evaluation of migraine symptoms for the last 10 days.  Reports phonophobia photophobia, strong smells worsen the headache.   Headache is accompanied by nausea, sometimes vomiting. States that she has had Toradol in the past which has helped. Has sumatriptan at home which has not been helpful with this migraine. Has been scribed Ubrelvy in the past, insurance denied in 2023. She typically gets 1 migraine a month, but it lasts 3 days. She took sumatriptan at 2 AM this morning, 2 hours later she still had a headache and took a second relief.  Separately reports night sweats for the last couple weeks. Has not attempted OTC treatment. Reports she does believe she is in "full-blown menopause." Denies any recent illness. Denies other concerns today.   ROS Per HPI      Objective:    BP (!) 128/96   Pulse 73   Temp 98.4 F (36.9 C)   Ht 5\' 2"  (1.575 m)   Wt 201 lb 9.6 oz (91.4 kg)   LMP 05/10/2017 (Exact Date)   SpO2 98%   BMI 36.87 kg/m    Physical Exam Constitutional:      General: She is not in acute distress.    Comments: Appears uncomfortable  HENT:     Head: Normocephalic and atraumatic.     Nose: Nose normal.  Eyes:     Extraocular Movements: Extraocular movements intact.     Pupils: Pupils are equal, round, and reactive to light.  Pulmonary:     Effort: Pulmonary effort is normal.  Musculoskeletal:        General: Normal range of motion.      Cervical back: Normal range of motion.  Skin:    General: Skin is warm and dry.  Neurological:     General: No focal deficit present.     Mental Status: She is alert and oriented to person, place, and time.     No results found for any visits on 05/20/23.      Assessment & Plan:  1. Intractable migraine without aura and without status migrainosus (Primary)  - rizatriptan (MAXALT) 10 MG tablet; Take 1 tablet (10 mg total) by mouth as needed for migraine. May repeat in 2 hours if needed  Dispense: 10 tablet; Refill: 0 - ketorolac (TORADOL) injection 60 mg - methylPREDNISolone acetate (DEPO-MEDROL) injection 80 mg -Sample of Ubrelvy 50 mg given in the case that rizatriptan is not helpful. Discussed to take 1 tablet at the onset of migraine.  States that she has had a sample of this in the past and this worked very well for her.  2. Nausea  - ondansetron (ZOFRAN-ODT) 4 MG disintegrating tablet; Take 1 tablet (4 mg total) by mouth every 8 (eight) hours as needed for nausea or vomiting.  Dispense: 20 tablet; Refill: 0   Meds ordered this encounter  Medications   rizatriptan (MAXALT) 10 MG tablet    Sig: Take 1 tablet (  10 mg total) by mouth as needed for migraine. May repeat in 2 hours if needed    Dispense:  10 tablet    Refill:  0   ketorolac (TORADOL) injection 60 mg   methylPREDNISolone acetate (DEPO-MEDROL) injection 80 mg   ondansetron (ZOFRAN-ODT) 4 MG disintegrating tablet    Sig: Take 1 tablet (4 mg total) by mouth every 8 (eight) hours as needed for nausea or vomiting.    Dispense:  20 tablet    Refill:  0    Return if symptoms worsen or fail to improve.  Moshe Cipro, FNP

## 2023-08-25 ENCOUNTER — Ambulatory Visit: Payer: Self-pay | Admitting: *Deleted

## 2023-08-25 ENCOUNTER — Telehealth: Payer: Self-pay | Admitting: Internal Medicine

## 2023-08-25 NOTE — Telephone Encounter (Signed)
 Copied from CRM [id number]. Topic: Clinical - Medication Question >> Aug 25, 2023  1:10 PM Chuck Crater wrote: Reason for CRM: Patient wants to know if Casimer Clear was able to check on getting her a prescription for Ubrelvy  50 mg or if something else can be called in. She states that over the counter medications doesn't work. Reason for Disposition  [1] Caller has URGENT medicine question about med that PCP or specialist prescribed AND [2] triager unable to answer question  Answer Assessment - Initial Assessment Questions 1. NAME of MEDICINE: "What medicine(s) are you calling about?"     Ubrelvy  50 mg 2. QUESTION: "What is your question?" (e.g., double dose of medicine, side effect)     A year ago my insurance refused this but Casimer Clear, FNP gave me a sample of it during my last visit on 05/20/2023.  I took it and it really helped with the migraine headache.     I was wondering if Trevor Fudge could resubmit for the Ubrelvy  and see if my insurance will pay for it this time. I've taken every thing I can OTC and nothing helps.      Imitrex  helps.   I'm taking one every 2 hours.  I let her know she should not be taking an Imitrex  every 2 hours.  "Oh my gosh, I did not know that"!  "Well I am".   I let her know not to do that.  She can take one and then one more 2 hours later but not to exceed 2 pills in a 24 hour period.  She verbalized understanding and asked so what can I take?   I get a headache once a month.   Casimer Clear, FNP stopped the Imitrex  but I had one left.   I just took it about an hour ago.  Nothing is getting rid of these headaches.   I came in and they gave me a shot last time I was in.  Ask Trevor Fudge what I should do for pain until we hear something about the Ubrelvy .    I don't have anything else to take and nothing OTC helps at all.     3. PRESCRIBER: "Who prescribed the medicine?" Reason: if prescribed by specialist, call should be referred to that group.      Casimer Clear, FNP 4. SYMPTOMS: "Do you have any symptoms?" If Yes, ask: "What symptoms are you having?"  "How bad are the symptoms (e.g., mild, moderate, severe)     Migraine headaches 5. PREGNANCY:  "Is there any chance that you are pregnant?" "When was your last menstrual period?"     Not asked due to age  Protocols used: Medication Question Call-A-AH  Chief Complaint: Requesting that Ubrelvy  50 mg be sent in for migraine headaches.  She took the sample Casimer Clear, FNP gave her on 05/20/2023 during that office visit and it worked good.  Last year her insurance co. Would not pay for it.   She is hoping it will now. She also asking for something for pain in the meantime.   She just took the last Imitrex  she had an hour ago.  Symptoms: Migraine headache Frequency: Once a month Pertinent Negatives: Patient denies OTC medications helping at all Disposition: [] ED /[] Urgent Care (no appt availability in office) / [] Appointment(In office/virtual)/ []  [facility]/ [] Home Care/ [] Refused Recommended Disposition /[] [facility]/ [x]  Follow-up with PCP Additional Notes: Message sent to Casimer Clear, FNP for rx for Ubrelvy  also needing something  for pain in the meantime.   The shot she got last time she was in (05/20/2023) really helped too.

## 2023-08-25 NOTE — Telephone Encounter (Signed)
 Copied from CRM [id number]. Topic: General - Other >> Aug 25, 2023  3:34 PM Turkey A wrote: Reason for CRM: Patient was calling back to let office know that her insurance approved Ubrelvy  50 mg

## 2023-08-25 NOTE — Telephone Encounter (Signed)
Spoke with patient, scheduled an OV

## 2023-08-26 ENCOUNTER — Ambulatory Visit: Admitting: Family Medicine

## 2023-08-26 ENCOUNTER — Encounter: Payer: Self-pay | Admitting: Family Medicine

## 2023-08-26 VITALS — BP 136/92 | HR 75 | Temp 97.8°F | Ht 62.0 in | Wt 195.0 lb

## 2023-08-26 DIAGNOSIS — G43019 Migraine without aura, intractable, without status migrainosus: Secondary | ICD-10-CM | POA: Diagnosis not present

## 2023-08-26 DIAGNOSIS — R053 Chronic cough: Secondary | ICD-10-CM

## 2023-08-26 DIAGNOSIS — I10 Essential (primary) hypertension: Secondary | ICD-10-CM

## 2023-08-26 DIAGNOSIS — K219 Gastro-esophageal reflux disease without esophagitis: Secondary | ICD-10-CM

## 2023-08-26 MED ORDER — UBRELVY 50 MG PO TABS: ORAL_TABLET | ORAL | 0 refills | Status: AC

## 2023-08-26 MED ORDER — FAMOTIDINE 40 MG PO TABS: 40.0000 mg | ORAL_TABLET | Freq: Two times a day (BID) | ORAL | 0 refills | Status: DC

## 2023-08-26 MED ORDER — KETOROLAC TROMETHAMINE 60 MG/2ML IM SOLN
60.0000 mg | Freq: Once | INTRAMUSCULAR | Status: AC
Start: 2023-08-26 — End: 2023-08-26
  Administered 2023-08-26: 60 mg via INTRAMUSCULAR

## 2023-08-26 MED ORDER — METHYLPREDNISOLONE ACETATE 80 MG/ML IJ SUSP
80.0000 mg | Freq: Once | INTRAMUSCULAR | Status: AC
Start: 2023-08-26 — End: 2023-08-26
  Administered 2023-08-26: 80 mg via INTRAMUSCULAR

## 2023-08-26 NOTE — Patient Instructions (Addendum)
 For your cough, take Pepcid twice daily for the next 2 weeks.  Avoid eating and laying down within 2 to 3 hours. I also recommend taking Xyzal or Zyrtec or Claritin to treat allergies.  Follow-up with Dr. Nicolette Barrio if your cough is not improving  For your headache, you were given Toradol  60 mg IM and Depo-Medrol  80 mg IM in the office today.  Take the sample of Ubrelvy  100 mg x 1 dose.  I sent a prescription for Ubrelvy  to your pharmacy.  We will have to get this approved.  Monitor your blood pressure and make sure it normalizes 130/80 or lower

## 2023-08-26 NOTE — Progress Notes (Deleted)
   Acute Office Visit  Subjective:     Patient ID: Aimee Anderson, female    DOB: 12-Feb-1966, 58 y.o.   MRN: [id number]  No chief complaint on file.   HPI Patient is in today for evaluation of migraines.    ROS Per HPI      Objective:    LMP 05/10/2017 (Exact Date)    Physical Exam Vitals and nursing note reviewed.  Constitutional:      General: She is not in acute distress.    Appearance: Normal appearance. She is normal weight.  HENT:     Head: Normocephalic and atraumatic.     Right Ear: External ear normal.     Left Ear: External ear normal.     Nose: Nose normal.     Mouth/Throat:     Mouth: Mucous membranes are moist.     Pharynx: Oropharynx is clear.  Eyes:     Extraocular Movements: Extraocular movements intact.     Pupils: Pupils are equal, round, and reactive to light.  Cardiovascular:     Rate and Rhythm: Normal rate and regular rhythm.     Pulses: Normal pulses.     Heart sounds: Normal heart sounds.  Pulmonary:     Effort: Pulmonary effort is normal. No respiratory distress.     Breath sounds: Normal breath sounds. No wheezing, rhonchi or rales.  Musculoskeletal:        General: Normal range of motion.     Cervical back: Normal range of motion.     Right lower leg: No edema.     Left lower leg: No edema.  Lymphadenopathy:     Cervical: No cervical adenopathy.  Neurological:     General: No focal deficit present.     Mental Status: She is alert and oriented to person, place, and time.  Psychiatric:        Mood and Affect: Mood normal.        Thought Content: Thought content normal.   No results found for any visits on 08/26/23.      Assessment & Plan:   Intractable migraine without aura and without status migrainosus  Essential hypertension     No orders of the defined types were placed in this encounter.   No follow-ups on file.  Wellington Half, FNP

## 2023-08-26 NOTE — Progress Notes (Unsigned)
 Subjective:     Patient ID: Aimee Anderson, female    DOB: 1965-12-17, 58 y.o.   MRN: [id number]  Chief Complaint  Patient presents with   Headache    Once a month headache, most recent started Monday going into today    HPI  History of Present Illness          Here with complaints of headache x 3 days. States is feels like her usual migraine headache.  She has tried 2 different triptans in the past including most recently she took rizatriptan  and it did not help at all. She had a sample of Ubrelvy  that was provided at her last PCP appt for migraines and she took that in January and it did relieve her migraine.  Reports also taking sumatriptan  in the past without any migraine relief.   Also c/o chronic cough for months. Cough is worse when laying down or after eating. She is not taking an acid reducer.     Health Maintenance Due  Topic Date Due   Pneumococcal Vaccine 36-60 Years old (1 of 2 - PCV) Never done   Cervical Cancer Screening (HPV/Pap Cotest)  04/06/2021   DTaP/Tdap/Td (2 - Td or Tdap) 04/06/2023    Past Medical History:  Diagnosis Date   Allergy    ALLERGY, HX OF 06/22/2007   Anemia    History   Arthritis    Depression    FLANK PAIN, RIGHT 04/15/2009   HYPERTENSION 06/22/2007   Hypertension    Sickle cell anemia (HCC)    Sickle cell Trait   Sickle cell trait (HCC)    SVD (spontaneous vaginal delivery)    x 1   Uterine fibroid     Past Surgical History:  Procedure Laterality Date   CESAREAN SECTION     x 1   COLONOSCOPY     DILATATION & CURETTAGE/HYSTEROSCOPY WITH MYOSURE N/A 01/21/2016   Procedure: DILATATION & CURETTAGE/HYSTEROSCOPY WITH MYOSURE;  Surgeon: Ivery Marking, MD;  Location: [facility];  Service: Gynecology;  Laterality: N/A;   TONSILLECTOMY     TUBAL LIGATION      Family History  Problem Relation Age of Onset   Hypertension Mother    Ulcers Father    Gout Father    Diabetes Father    Heart disease Father    Bladder Cancer  Father    Breast cancer Maternal Grandmother    Colon cancer Brother    Esophageal cancer Neg Hx    Liver cancer Neg Hx    Pancreatic cancer Neg Hx    Rectal cancer Neg Hx    Stomach cancer Neg Hx     Social History   Socioeconomic History   Marital status: Single    Spouse name: Not on file   Number of children: 2   Years of education: Not on file   Highest education level: Bachelor's degree (e.g., BA, AB, BS)  Occupational History   Occupation: Self Employed  Tobacco Use   Smoking status: Never   Smokeless tobacco: Never  Vaping Use   Vaping status: Never Used  Substance and Sexual Activity   Alcohol use: Yes   Drug use: No   Sexual activity: Not on file  Other Topics Concern   Not on file  Social History Narrative   ** Merged History Encounter **       HSG, married "82, 2 daughters-'94,'98   Self-employed:custom draperies and window treatment, 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   Social Drivers of Health  Financial Resource Strain: Low Risk  (08/29/2023)   Overall Financial Resource Strain (CARDIA)    Difficulty of Paying Living Expenses: Not hard at all  Food Insecurity: No Food Insecurity (08/29/2023)   Hunger Vital Sign    Worried About Running Out of Food in the Last Year: Never true    Ran Out of Food in the Last Year: Never true  Transportation Needs: No Transportation Needs (08/29/2023)   PRAPARE - Administrator, Civil Service (Medical): No    Lack of Transportation (Non-Medical): No  Physical Activity: Sufficiently Active (08/29/2023)   Exercise Vital Sign    Days of Exercise per Week: 4 days    Minutes of Exercise per Session: 40 min  Stress: Stress Concern Present (08/29/2023)   [employer] of Occupational Health - Occupational Stress Questionnaire    Feeling of Stress : Rather much  Social Connections: Moderately Isolated (08/29/2023)   Social Connection and Isolation Panel [NHANES]    Frequency of Communication with Friends and Family: More than  three times a week    Frequency of Social Gatherings with Friends and Family: Three times a week    Attends Religious Services: More than 4 times per year    Active Member of Clubs or Organizations: No    Attends Banker Meetings: Never    Marital Status: Widowed  Intimate Partner Violence: Not At Risk (08/29/2023)   Humiliation, Afraid, Rape, and Kick questionnaire    Fear of Current or Ex-Partner: No    Emotionally Abused: No    Physically Abused: No    Sexually Abused: No    Outpatient Medications Prior to Visit  Medication Sig Dispense Refill   cetirizine (ZYRTEC) 10 MG tablet Take 10 mg by mouth daily.     ELDERBERRY PO Take by mouth.     losartan -hydrochlorothiazide  (HYZAAR) 50-12.5 MG tablet Take 1 tablet by mouth daily. 90 tablet 3   Multiple Vitamin (MULTIVITAMIN) tablet Take 1 tablet by mouth daily.     Omega-3 1000 MG CAPS Take by mouth.     OVER THE COUNTER MEDICATION Vitamin C, 3,000 mg daily.     OVER THE COUNTER MEDICATION Vitamin B Complex, one tablet daily.     OVER THE COUNTER MEDICATION Calcium 600 mg, Once a day.     ibuprofen  (ADVIL ) 600 MG tablet Take 1 tablet (600 mg total) by mouth every 8 (eight) hours as needed. 30 tablet 0   ondansetron  (ZOFRAN -ODT) 4 MG disintegrating tablet Take 1 tablet (4 mg total) by mouth every 8 (eight) hours as needed for nausea or vomiting. 20 tablet 0   promethazine -dextromethorphan (PROMETHAZINE -DM) 6.25-15 MG/5ML syrup Take 5 mLs by mouth 4 (four) times daily as needed. (Patient not taking: Reported on 05/20/2023) 118 mL 0   rizatriptan  (MAXALT ) 10 MG tablet Take 1 tablet (10 mg total) by mouth as needed for migraine. May repeat in 2 hours if needed (Patient not taking: Reported on 08/26/2023) 10 tablet 0   No facility-administered medications prior to visit.    Allergies  Allergen Reactions   Kiwi Extract Anaphylaxis   Penicillins    Penicillins Hives    Has patient had a PCN reaction causing immediate rash,  facial/tongue/throat swelling, SOB or lightheadedness with hypotension: Yes Has patient had a PCN reaction causing severe rash involving mucus membranes or skin necrosis: Yes Has patient had a PCN reaction that required hospitalization No Has patient had a PCN reaction occurring within the last 10 years: No If  all of the above answers are "NO", then may proceed with Cephalosporin use.     Review of Systems  Constitutional:  Negative for chills, diaphoresis, fever and malaise/fatigue.  HENT:  Negative for congestion, ear pain, sinus pain and sore throat.   Eyes:  Negative for blurred vision, double vision and photophobia.  Respiratory:  Positive for cough. Negative for sputum production, shortness of breath and wheezing.   Cardiovascular:  Negative for chest pain, palpitations and leg swelling.  Gastrointestinal:  Negative for abdominal pain, constipation, diarrhea, nausea and vomiting.  Genitourinary:  Negative for dysuria, frequency and urgency.  Musculoskeletal:  Negative for falls and joint pain.  Neurological:  Positive for headaches. Negative for dizziness, tingling, tremors, speech change and focal weakness.  Psychiatric/Behavioral:  Negative for depression. The patient is not nervous/anxious.        Objective:    Physical Exam Constitutional:      General: She is not in acute distress.    Appearance: She is not ill-appearing.  HENT:     Mouth/Throat:     Pharynx: Oropharynx is clear.  Eyes:     General: No visual field deficit.    Extraocular Movements: Extraocular movements intact.     Right eye: Normal extraocular motion and no nystagmus.     Left eye: Normal extraocular motion and no nystagmus.     Conjunctiva/sclera: Conjunctivae normal.     Pupils: Pupils are equal, round, and reactive to light.  Cardiovascular:     Rate and Rhythm: Normal rate and regular rhythm.  Pulmonary:     Effort: Pulmonary effort is normal.     Breath sounds: Normal breath sounds.   Musculoskeletal:        General: Normal range of motion.     Cervical back: Normal range of motion and neck supple. No rigidity.  Lymphadenopathy:     Cervical: No cervical adenopathy.  Skin:    General: Skin is warm and dry.  Neurological:     General: No focal deficit present.     Mental Status: She is alert and oriented to person, place, and time.     Cranial Nerves: No cranial nerve deficit, dysarthria or facial asymmetry.     Sensory: No sensory deficit.     Motor: No weakness, tremor or pronator drift.     Coordination: Romberg sign negative. Coordination normal. Finger-Nose-Finger Test normal.     Gait: Gait is intact.     Deep Tendon Reflexes: Reflexes normal.  Psychiatric:        Mood and Affect: Mood normal.        Speech: Speech normal.        Behavior: Behavior normal.        Thought Content: Thought content normal.      BP (!) 136/92 (BP Location: Left Arm, Patient Position: Sitting)   Pulse 75   Temp 97.8 F (36.6 C) (Temporal)   Ht 5\' 2"  (1.575 m)   Wt 195 lb (88.5 kg)   LMP 05/10/2017 (Exact Date)   SpO2 98%   BMI 35.67 kg/m  Wt Readings from Last 3 Encounters:  08/29/23 197 lb 9.6 oz (89.6 kg)  08/26/23 195 lb (88.5 kg)  05/20/23 201 lb 9.6 oz (91.4 kg)       Assessment & Plan:   Problem List Items Addressed This Visit     Cough   Start Pepcid bid. Lifestyle modifications discussed to help control GERD. She will also restart OTC allergy medication.  Follow  up with PCP.       Essential hypertension   Advised her of elevated BP today. Most likely r/t headache but she will monitor closely and follow up with PCP.       Intractable migraine without aura and without status migrainosus - Primary   Benign neurological exam. Rizatriptan  and sumatriptan  ineffective for migraines. Ubrelvy  sample effective in January. Toradol  60 mg IM and Depo-Medrol  80 mg IM given in office today, this has worked in the past. [place] sample given today and prescription  sent. Follow up if no improvement by next week or go to the ED if she has any worsening symptoms over the weekend.       Relevant Medications   Ubrogepant  (UBRELVY ) 50 MG TABS   Other Visit Diagnoses       Gastroesophageal reflux disease, unspecified whether esophagitis present       Relevant Medications   famotidine (PEPCID) 40 MG tablet       I have discontinued Vergia B. Rasp's promethazine -dextromethorphan and rizatriptan . I am also having her start on famotidine and Ubrelvy . Additionally, I am having her maintain her multivitamin, OVER THE COUNTER MEDICATION, OVER THE COUNTER MEDICATION, OVER THE COUNTER MEDICATION, Omega-3, ELDERBERRY PO, losartan -hydrochlorothiazide , and cetirizine. We administered ketorolac  and methylPREDNISolone  acetate.  Meds ordered this encounter  Medications   famotidine (PEPCID) 40 MG tablet    Sig: Take 1 tablet (40 mg total) by mouth 2 (two) times daily.    Dispense:  60 tablet    Refill:  0    Supervising Provider:   Bambi Lever A [[id number]]   ketorolac  (TORADOL ) injection 60 mg   methylPREDNISolone  acetate (DEPO-MEDROL ) injection 80 mg   Ubrogepant  (UBRELVY ) 50 MG TABS    Sig: Take 1 tablet (50 mg) at onset of migraine. May repeat dose x 1 after 2 hours.    Dispense:  16 tablet    Refill:  0    Supervising Provider:   Bambi Lever A [[id number]]

## 2023-08-29 ENCOUNTER — Telehealth (HOSPITAL_BASED_OUTPATIENT_CLINIC_OR_DEPARTMENT_OTHER): Payer: Self-pay | Admitting: Cardiovascular Disease

## 2023-08-29 ENCOUNTER — Encounter (HOSPITAL_BASED_OUTPATIENT_CLINIC_OR_DEPARTMENT_OTHER): Payer: Self-pay | Admitting: Cardiovascular Disease

## 2023-08-29 ENCOUNTER — Encounter (HOSPITAL_BASED_OUTPATIENT_CLINIC_OR_DEPARTMENT_OTHER): Payer: Self-pay

## 2023-08-29 ENCOUNTER — Ambulatory Visit (HOSPITAL_BASED_OUTPATIENT_CLINIC_OR_DEPARTMENT_OTHER): Admitting: Cardiovascular Disease

## 2023-08-29 ENCOUNTER — Telehealth: Payer: Self-pay

## 2023-08-29 VITALS — BP 146/96 | HR 76 | Ht 62.5 in | Wt 197.6 lb

## 2023-08-29 DIAGNOSIS — R600 Localized edema: Secondary | ICD-10-CM | POA: Insufficient documentation

## 2023-08-29 DIAGNOSIS — M79605 Pain in left leg: Secondary | ICD-10-CM | POA: Diagnosis not present

## 2023-08-29 DIAGNOSIS — I10 Essential (primary) hypertension: Secondary | ICD-10-CM | POA: Diagnosis not present

## 2023-08-29 DIAGNOSIS — M79606 Pain in leg, unspecified: Secondary | ICD-10-CM | POA: Insufficient documentation

## 2023-08-29 HISTORY — DX: Pain in leg, unspecified: M79.606

## 2023-08-29 HISTORY — DX: Localized edema: R60.0

## 2023-08-29 MED ORDER — PANTOPRAZOLE SODIUM 40 MG PO TBEC: 40.0000 mg | DELAYED_RELEASE_TABLET | Freq: Every day | ORAL | 3 refills | Status: DC

## 2023-08-29 NOTE — Progress Notes (Signed)
 Cardiology Office Note:  .   Date:  08/29/2023  ID:  Aimee Anderson, DOB 1966-02-16, MRN [id number] PCP: Aimee Homestead, MD  [facility] Providers Cardiologist:  None    History of Present Illness: .   Aimee Anderson is a 58 y.o. female with hypertension, GERD, migraines, and sickle cell trait here as a self-referral for evaluation of multiple concerns.   Discussed the use of AI scribe software for clinical note transcription with the patient, who gave verbal consent to proceed.  History of Present Illness [name] has experienced a persistent cough for about a year, worsening over time. The cough is particularly bothersome at night when lying down, but also occurs during the day. No heartburn or burning sensation in the chest. She uses Zyrtec daily for suspected allergies without improvement. Shortness of breath is associated with the cough, but not when lying down.  She experiences tingling and numbness in her left leg, sometimes causing foot cramps, occurring for the past couple of months. The numbness improves with movement but does not resolve completely. She reports ankle swelling, particularly on the right, without any injury. No swelling in the feet. She exercises four times a week, including elliptical, treadmill intervals, and weightlifting, without chest pain or pressure.  She describes a throbbing sensation in her neck veins, particularly on the right side, attributed to stress, accompanied by a feeling of tightness, more noticeable when stressed. 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  She has a history of hypertension and has been on losartan  since her pregnancies. Her blood pressure has been elevated recently, with readings around 130/90, previously 120/80. She does not monitor her blood pressure at home. Her family history is significant for her father having congestive heart  failure.  ROS:  As per HPI  Studies Reviewed: Aimee Anderson   EKG Interpretation Date/Time:  Monday Aug 29 2023 10:12:27 EDT Ventricular Rate:  76 PR Interval:  252 QRS Duration:  114 QT Interval:  370 QTC Calculation: 416 R Axis:   76  Text Interpretation: Sinus rhythm with 1st degree A-V block Possible Left atrial enlargement Low voltage QRS Cannot rule out Anteroseptal infarct No significant change since last tracing Confirmed by Aimee Anderson ([id number]) on 08/29/2023 10:26:36 AM     Risk Assessment/Calculations:     HYPERTENSION CONTROL Vitals:   08/29/23 1019 08/29/23 1055  BP: (!) 134/96 (!) 146/96    The patient's blood pressure is elevated above target today.  In order to address the patient's elevated BP: Blood pressure will be monitored at home to determine if medication changes need to be made.          Physical Exam:   VS:  BP (!) 146/96   Pulse 76   Ht 5' 2.5" (1.588 m)   Wt 197 lb 9.6 oz (89.6 kg)   LMP 05/10/2017 (Exact Date)   SpO2 94%   BMI 35.57 kg/m  , BMI Body mass index is 35.57 kg/m. GENERAL:  Well appearing HEENT: Pupils equal round and reactive, fundi not visualized, oral mucosa unremarkable NECK:  No jugular venous distention, waveform within normal limits, carotid upstroke brisk and symmetric, no bruits, no thyromegaly LUNGS:  Clear to auscultation bilaterally HEART:  RRR.  PMI not displaced or sustained,S1 and S2 within normal limits, no S3, no S4, no clicks, no rubs, no murmurs ABD:  Flat, positive bowel sounds normal in frequency in pitch, no  bruits, no rebound, no guarding, no midline pulsatile mass, no hepatomegaly, no splenomegaly EXT:  2 plus pulses throughout, no edema, no cyanosis no clubbing SKIN:  No rashes no nodules NEURO:  Cranial nerves II through XII grossly intact, motor grossly intact throughout PSYCH:  Cognitively intact, oriented to person place and time  ASSESSMENT AND PLAN: .    Assessment & Plan # Hypertension Blood pressure  elevated at 146/96. Typically controlled with losartan . Recent stress and medication interactions may contribute. Discussed ibuprofen  and Maxalt  impact. - Home blood pressure monitoring twice daily for two weeks.  BP goal <130/80 - Provide information on reliable blood pressure cuffs, such as Omron or Pierce Brewster. - Continue losartan /hydrochlorothiazide  in the morning. - Avoid frequent use of ibuprofen  and Maxalt .  # Venous insufficiency Swelling in ankles likely due to venous insufficiency. Compression socks and leg elevation recommended. - Recommend wearing compression socks. - Advise elevating legs when sitting. - Order echocardiogram to rule out heart failure.  # Chronic cough Chronic cough over a year, worse at night. Suspected acid reflux. Discussed silent reflux and Protonix benefits. - Prescribe Protonix 40 mg daily before breakfast for one month. - Advise dietary modifications to avoid spicy foods, red sauces, and peppermint. - Recommend sleeping with head elevated and avoiding lying down within 2-3 hours after eating.  # Numbness in left leg Intermittent numbness and cramping, likely nerve-related from vertebral fractures. Symptoms improve with movement. - Order lower extremity doppler ultrasound to assess arterial blood flow. - Consider nerve-related causes due to history of vertebral fractures.  # Migraine Monthly migraines, sumatriptan  and Maxalt  ineffective. Awaiting Ubrelvy  approval. Discussed Maxalt  impact on blood pressure. - Await insurance approval for Ubrelvy . - Avoid Maxalt  and ibuprofen  as able as they can exacerbate hypertension.  # Vertebral fractures Vertebral fractures from February 2022 fall, contributing to leg numbness and cramping.  # General Health Maintenance Discussed calcium score test benefits for coronary artery disease detection. Explained cost and insurance coverage. - Recommend calcium score test for early detection of coronary artery  disease.  Dispo: f/u 2 months  Signed, Aimee Sos, MD

## 2023-08-29 NOTE — Patient Instructions (Addendum)
 Medication Instructions:  START PANTOPRAZOLE 40 MG DAILY   *If you need a refill on your cardiac medications before your next appointment, please call your pharmacy*  Lab Work: NONE  Testing/Procedures: Your physician has requested that you have an echocardiogram. Echocardiography is a painless test that uses sound waves to create images of your heart. It provides your doctor with information about the size and shape of your heart and how well your heart's chambers and valves are working. This procedure takes approximately one hour. There are no restrictions for this procedure. Please do NOT wear cologne, perfume, aftershave, or lotions (deodorant is allowed). Please arrive 15 minutes prior to your appointment time.  Please note: We ask at that you not bring children with you during ultrasound (echo/ vascular) testing. Due to room size and safety concerns, children are not allowed in the ultrasound rooms during exams. Our front office staff cannot provide observation of children in our lobby area while testing is being conducted. An adult accompanying a patient to their appointment will only be allowed in the ultrasound room at the discretion of the ultrasound technician under special circumstances. We apologize for any inconvenience.  Your physician has requested that you have an ankle brachial index (ABI). During this test an ultrasound and blood pressure cuff are used to evaluate the arteries that supply the arms and legs with blood. Allow thirty minutes for this exam. There are no restrictions or special instructions.  Please note: We ask at that you not bring children with you during ultrasound (echo/ vascular) testing. Due to room size and safety concerns, children are not allowed in the ultrasound rooms during exams. Our front office staff cannot provide observation of children in our lobby area while testing is being conducted. An adult accompanying a patient to their appointment will only  be allowed in the ultrasound room at the discretion of the ultrasound technician under special circumstances. We apologize for any inconvenience.  Your physician has requested that you have a lower or upper extremity arterial duplex. This test is an ultrasound of the arteries in the legs or arms. It looks at arterial blood flow in the legs and arms. Allow one hour for Lower and Upper Arterial scans. There are no restrictions or special instructions.  Please note: We ask at that you not bring children with you during ultrasound (echo/ vascular) testing. Due to room size and safety concerns, children are not allowed in the ultrasound rooms during exams. Our front office staff cannot provide observation of children in our lobby area while testing is being conducted. An adult accompanying a patient to their appointment will only be allowed in the ultrasound room at the discretion of the ultrasound technician under special circumstances. We apologize for any inconvenience.  Your physician has recommend you to have a coronary calcium score. This is a self pay test that will cost $99  Follow-Up: 11/03/2023 10:00 AM WITH DR [facility]   We recommend signing up for the patient portal called "MyChart".  Sign up information is provided on this After Visit Summary.  MyChart is used to connect with patients for Virtual Visits (Telemedicine).  Patients are able to view lab/test results, encounter notes, upcoming appointments, etc.  Non-urgent messages can be sent to your provider as well.   To learn more about what you can do with MyChart, go to [email or web address].   Other Instructions  [email or web address] to find a validated blood pressure cuff.

## 2023-08-29 NOTE — Assessment & Plan Note (Addendum)
 Benign neurological exam. Rizatriptan  and sumatriptan  ineffective for migraines. Ubrelvy  sample effective in January. Toradol  60 mg IM and Depo-Medrol  80 mg IM given in office today, this has worked in the past. [place] sample given today and prescription sent. Follow up if no improvement by next week or go to the ED if she has any worsening symptoms over the weekend.

## 2023-08-29 NOTE — Telephone Encounter (Signed)
 Spoke with patient and reviewed information, listed on AVS under other instructions

## 2023-08-29 NOTE — Assessment & Plan Note (Signed)
 Advised her of elevated BP today. Most likely r/t headache but she will monitor closely and follow up with PCP.

## 2023-08-29 NOTE — Assessment & Plan Note (Signed)
 Start Pepcid bid. Lifestyle modifications discussed to help control GERD. She will also restart OTC allergy medication.  Follow up with PCP.

## 2023-08-29 NOTE — Telephone Encounter (Signed)
 Pharmacy Patient Advocate Encounter   Received notification from CoverMyMeds that prior authorization for Ubrelvy  50MG  tablets is required/requested.   Insurance verification completed.   The patient is insured through [employer] .   Per test claim: PA required; PA submitted to above mentioned insurance via CoverMyMeds Key/confirmation #/EOC [id number] Status is pending

## 2023-08-29 NOTE — Telephone Encounter (Signed)
 Patient stated that Dr. Theodis Fiscal recommended a blood pressure reading and mentioned that she would be provided with a website to obtain it. However, she said she could not find any note about this in her After Visit Summary (AVS).

## 2023-08-31 ENCOUNTER — Telehealth: Payer: Self-pay

## 2023-08-31 ENCOUNTER — Other Ambulatory Visit (HOSPITAL_COMMUNITY): Payer: Self-pay

## 2023-08-31 NOTE — Telephone Encounter (Signed)
 Addt info required and submitted. Case number same as noted and previous chartnote.  STATUS: APPROVED   Pharmacy Patient Advocate Encounter  Received notification from [employer] that Prior Authorization for Ubrelvy  50MG  tablets has been APPROVED from 4.7.25 to 5.7.26. Ran test claim, Copay is $RTS, RX FILLED ON 08/31/23. This test claim was processed through [facility]- copay amounts may vary at other pharmacies due to pharmacy/plan contracts, or as the patient moves through the different stages of their insurance plan.   PA #/Case ID/Reference #: (Key: [id number])

## 2023-09-09 ENCOUNTER — Ambulatory Visit (HOSPITAL_BASED_OUTPATIENT_CLINIC_OR_DEPARTMENT_OTHER)
Admission: RE | Admit: 2023-09-09 | Discharge: 2023-09-09 | Disposition: A | Discharge status: Home / Self Care | Payer: Self-pay | Source: Ambulatory Visit | Attending: Cardiovascular Disease | Admitting: Cardiovascular Disease

## 2023-09-09 DIAGNOSIS — I10 Essential (primary) hypertension: Secondary | ICD-10-CM | POA: Insufficient documentation

## 2023-09-12 ENCOUNTER — Ambulatory Visit (HOSPITAL_BASED_OUTPATIENT_CLINIC_OR_DEPARTMENT_OTHER): Payer: Self-pay | Admitting: Cardiovascular Disease

## 2023-09-29 ENCOUNTER — Ambulatory Visit (HOSPITAL_BASED_OUTPATIENT_CLINIC_OR_DEPARTMENT_OTHER)

## 2023-09-29 ENCOUNTER — Encounter (HOSPITAL_BASED_OUTPATIENT_CLINIC_OR_DEPARTMENT_OTHER): Payer: Self-pay

## 2023-09-29 DIAGNOSIS — R600 Localized edema: Secondary | ICD-10-CM

## 2023-09-29 DIAGNOSIS — M79605 Pain in left leg: Secondary | ICD-10-CM | POA: Diagnosis not present

## 2023-09-29 DIAGNOSIS — I10 Essential (primary) hypertension: Secondary | ICD-10-CM

## 2023-09-29 LAB — ECHOCARDIOGRAM COMPLETE
Area-P 1/2: 3.03 cm2
S' Lateral: 2.01 cm

## 2023-10-01 LAB — VAS US ABI WITH/WO TBI
Left ABI: 1.23
Right ABI: 1.19

## 2023-10-19 ENCOUNTER — Encounter: Payer: Self-pay | Admitting: Internal Medicine

## 2023-10-19 ENCOUNTER — Ambulatory Visit (INDEPENDENT_AMBULATORY_CARE_PROVIDER_SITE_OTHER): Admitting: Internal Medicine

## 2023-10-19 VITALS — BP 124/100 | HR 66 | Temp 98.2°F | Ht 62.5 in | Wt 193.0 lb

## 2023-10-19 DIAGNOSIS — G43019 Migraine without aura, intractable, without status migrainosus: Secondary | ICD-10-CM

## 2023-10-19 DIAGNOSIS — I10 Essential (primary) hypertension: Secondary | ICD-10-CM | POA: Diagnosis not present

## 2023-10-19 DIAGNOSIS — K219 Gastro-esophageal reflux disease without esophagitis: Secondary | ICD-10-CM | POA: Diagnosis not present

## 2023-10-19 DIAGNOSIS — Z0000 Encounter for general adult medical examination without abnormal findings: Secondary | ICD-10-CM | POA: Diagnosis not present

## 2023-10-19 DIAGNOSIS — Z13220 Encounter for screening for lipoid disorders: Secondary | ICD-10-CM | POA: Diagnosis not present

## 2023-10-19 LAB — COMPREHENSIVE METABOLIC PANEL WITH GFR
ALT: 24 U/L (ref 0–35)
AST: 23 U/L (ref 0–37)
Albumin: 4.1 g/dL (ref 3.5–5.2)
Alkaline Phosphatase: 52 U/L (ref 39–117)
BUN: 11 mg/dL (ref 6–23)
CO2: 31 meq/L (ref 19–32)
Calcium: 9.6 mg/dL (ref 8.4–10.5)
Chloride: 103 meq/L (ref 96–112)
Creatinine, Ser: 0.88 mg/dL (ref 0.40–1.20)
GFR: 71.65 mL/min (ref >60.00)
Glucose, Bld: 97 mg/dL (ref 70–99)
Potassium: 3.6 meq/L (ref 3.5–5.1)
Sodium: 140 meq/L (ref 135–145)
Total Bilirubin: 0.8 mg/dL (ref 0.2–1.2)
Total Protein: 7 g/dL (ref 6.0–8.3)

## 2023-10-19 LAB — LIPID PANEL
Cholesterol: 192 mg/dL (ref 0–200)
HDL: 70.1 mg/dL (ref >39.00)
LDL Cholesterol: 114 mg/dL — ABNORMAL HIGH (ref 0–99)
NonHDL: 121.85
Total CHOL/HDL Ratio: 3
Triglycerides: 41 mg/dL (ref 0.0–149.0)
VLDL: 8.2 mg/dL (ref 0.0–40.0)

## 2023-10-19 LAB — CBC
HCT: 39.2 % (ref 36.0–46.0)
Hemoglobin: 13.2 g/dL (ref 12.0–15.0)
MCHC: 33.6 g/dL (ref 30.0–36.0)
MCV: 83.1 fl (ref 78.0–100.0)
Platelets: 194 10*3/uL (ref 150.0–400.0)
RBC: 4.72 Mil/uL (ref 3.87–5.11)
RDW: 14.3 % (ref 11.5–15.5)
WBC: 8.1 10*3/uL (ref 4.0–10.5)

## 2023-10-19 LAB — HEMOGLOBIN A1C: Hgb A1c MFr Bld: 5.8 % (ref 4.6–6.5)

## 2023-10-19 MED ORDER — LOSARTAN POTASSIUM-HCTZ 50-12.5 MG PO TABS: 1.0000 | ORAL_TABLET | Freq: Every day | ORAL | 3 refills | Status: DC

## 2023-10-19 NOTE — Progress Notes (Signed)
   Subjective:   Patient ID: Aimee Anderson, female    DOB: 13-Mar-1966, 58 y.o.   MRN: [id number]  HPI The patient is here for physical.  PMH, [facility], social history reviewed and updated  Review of Systems  Constitutional: Negative.   HENT: Negative.    Eyes: Negative.   Respiratory:  Negative for cough, chest tightness and shortness of breath.   Cardiovascular:  Negative for chest pain, palpitations and leg swelling.  Gastrointestinal:  Negative for abdominal distention, abdominal pain, constipation, diarrhea, nausea and vomiting.  Musculoskeletal: Negative.   Skin: Negative.   Neurological: Negative.   Psychiatric/Behavioral: Negative.      Objective:  Physical Exam Constitutional:      Appearance: She is well-developed.  HENT:     Head: Normocephalic and atraumatic.   Cardiovascular:     Rate and Rhythm: Normal rate and regular rhythm.  Pulmonary:     Effort: Pulmonary effort is normal. No respiratory distress.     Breath sounds: Normal breath sounds. No wheezing or rales.  Abdominal:     General: Bowel sounds are normal. There is no distension.     Palpations: Abdomen is soft.     Tenderness: There is no abdominal tenderness. There is no rebound.   Musculoskeletal:     Cervical back: Normal range of motion.   Skin:    General: Skin is warm and dry.   Neurological:     Mental Status: She is alert and oriented to person, place, and time.     Coordination: Coordination normal.     Vitals:   10/19/23 0754 10/19/23 0823  BP: (!) 124/100 (!) 124/100  Pulse: 66   Temp: 98.2 F (36.8 C)   TempSrc: Oral   SpO2: 98%   Weight: 193 lb (87.5 kg)   Height: 5' 2.5 (1.588 m)     Assessment & Plan:

## 2023-10-19 NOTE — Assessment & Plan Note (Signed)
 Flu shot yearly. Shingrix complete. Tetanus counseled. Colonoscopy up to date. Mammogram up to date, pap smear up to date. Counseled about sun safety and mole surveillance. Counseled about the dangers of distracted driving. Given 10 year screening recommendations.

## 2023-10-19 NOTE — Assessment & Plan Note (Signed)
 Finally got ubrelvy  approved and this is helping when needed.

## 2023-10-19 NOTE — Assessment & Plan Note (Signed)
 Taking protonix  and pepcid  and this is helping reduce her chronic cough.

## 2023-10-19 NOTE — Assessment & Plan Note (Signed)
 BP elevated today but she has had normal readings at home. She will continue losartan /hydrochlorothiazide  50/12.5 mg daily. Checking CMP and adjust as needed.

## 2023-10-21 ENCOUNTER — Ambulatory Visit: Payer: Self-pay | Admitting: Internal Medicine

## 2023-10-24 ENCOUNTER — Other Ambulatory Visit: Payer: Self-pay | Admitting: Internal Medicine

## 2023-10-24 DIAGNOSIS — R7303 Prediabetes: Secondary | ICD-10-CM | POA: Insufficient documentation

## 2023-11-01 ENCOUNTER — Encounter: Payer: Self-pay | Admitting: *Deleted

## 2023-11-03 ENCOUNTER — Encounter (HOSPITAL_BASED_OUTPATIENT_CLINIC_OR_DEPARTMENT_OTHER): Payer: Self-pay | Admitting: Cardiovascular Disease

## 2023-11-03 ENCOUNTER — Ambulatory Visit (INDEPENDENT_AMBULATORY_CARE_PROVIDER_SITE_OTHER): Admitting: Cardiovascular Disease

## 2023-11-03 ENCOUNTER — Ambulatory Visit (HOSPITAL_BASED_OUTPATIENT_CLINIC_OR_DEPARTMENT_OTHER): Admitting: Cardiovascular Disease

## 2023-11-03 VITALS — BP 120/88 | HR 74 | Ht 62.75 in | Wt 195.4 lb

## 2023-11-03 DIAGNOSIS — I10 Essential (primary) hypertension: Secondary | ICD-10-CM | POA: Diagnosis not present

## 2023-11-03 DIAGNOSIS — Z5181 Encounter for therapeutic drug level monitoring: Secondary | ICD-10-CM

## 2023-11-03 MED ORDER — LOSARTAN POTASSIUM-HCTZ 100-12.5 MG PO TABS: 1.0000 | ORAL_TABLET | Freq: Every day | ORAL | 3 refills | Status: AC

## 2023-11-03 NOTE — Progress Notes (Signed)
 Cardiology Office Note:  .   Date:  11/03/2023  ID:  Aimee Anderson, DOB 10-20-1965, MRN [id number] PCP: Rollene Almarie LABOR, MD  [facility] Providers Cardiologist:  None    History of Present Illness: .   Aimee Anderson is a 58 y.o. female with hypertension, GERD, migraines, and sickle cell trait here for follow up.  SHe was first seen 08/2023 with cough, uncontrolled HTN and neck discomfort.  She noted cough for over a year.  She also had tingling and numbness in her left leg, sometimes causing foot cramps.  She noted a throbbing sensation in her neck veins, particularly on the right side, attributed to stress, accompanied by a feeling of tightness, more noticeable when stressed. She has a history of hypertension and has been on losartan  since her pregnancies. Her blood pressure has been elevated recently, with readings around 130/90, previously 120/80.  Her BP in the office was elevated.  We discussed stopping ibuprofen  and Maxalt .  Echo revealed LVEF 606-65% with mild LVH and normal diastolic function.  ABIs were normal. Calcium score was 0. She started a PPI and allergy medicine for her cough.  Discussed the use of AI scribe software for clinical note transcription with the patient, who gave verbal consent to proceed.  History of Present Illness Aimee Anderson has experienced a significant improvement in her cough, which was previously exacerbated at night when lying down or after eating.  She has adjusted the timing of her blood pressure medication to better regulate her levels. Previously, her blood pressure would rise to 130-140 mmHg by night. Since changing the timing to midday, her readings have improved to 127/80 mmHg or 130/85 mmHg. Stress from her job can elevate her blood pressure to 140/90 mmHg. No low blood pressure or symptoms like lightheadedness.  She is currently taking losartan  and hydrochlorothiazide . She recalls a past issue with low potassium levels, which was  previously managed with potassium supplements.  ROS:  As per HPI  Studies Reviewed: .       Echo 09/29/23: 1. Left ventricular ejection fraction, by estimation, is 60 to 65%. The  left ventricle has normal function. The left ventricle has no regional  wall motion abnormalities. mild to moderate left ventricular hypertrophy.  Left ventricular diastolic  parameters were normal. The average left ventricular global longitudinal  strain is -16.7 %. The global longitudinal strain is normal.   2. Right ventricular systolic function is normal. The right ventricular  size is normal. There is normal pulmonary artery systolic pressure. The  estimated right ventricular systolic pressure is 14.0 mmHg.   3. The mitral valve is grossly normal. No evidence of mitral valve  regurgitation. No evidence of mitral stenosis.   4. The aortic valve is tricuspid. Aortic valve regurgitation is not  visualized. Aortic valve sclerosis is present, with no evidence of aortic  valve stenosis.   5. The inferior vena cava is normal in size with greater than 50%  respiratory variability, suggesting right atrial pressure of 3 mmHg.   Calcium score 09/09/23: MPRESSION: Coronary calcium score of 0.    Risk Assessment/Calculations:             Physical Exam:   VS:  BP 120/88 (BP Location: Left Arm, Patient Position: Sitting, Cuff Size: Large)   Pulse 74   Ht 5' 2.75 (1.594 m)   Wt 195 lb 6.4 oz (88.6 kg)   LMP 05/10/2017 (Exact Date)   SpO2 98%   BMI 34.89 kg/m  ,  BMI Body mass index is 34.89 kg/m. GENERAL:  Well appearing HEENT: Pupils equal round and reactive, fundi not visualized, oral mucosa unremarkable NECK:  No jugular venous distention, waveform within normal limits, carotid upstroke brisk and symmetric, no bruits, no thyromegaly LUNGS:  Clear to auscultation bilaterally HEART:  RRR.  PMI not displaced or sustained,S1 and S2 within normal limits, no S3, no S4, no clicks, no rubs, no murmurs ABD:   Flat, positive bowel sounds normal in frequency in pitch, no bruits, no rebound, no guarding, no midline pulsatile mass, no hepatomegaly, no splenomegaly EXT:  2 plus pulses throughout, no edema, no cyanosis no clubbing SKIN:  No rashes no nodules NEURO:  Cranial nerves II through XII grossly intact, motor grossly intact throughout PSYCH:  Cognitively intact, oriented to person place and time  ASSESSMENT AND PLAN: .    Assessment & Plan # Hypertension Blood pressure well-controlled with losartan  and hydrochlorothiazide . Occasional stress-related elevations. Potassium borderline low due to hydrochlorothiazide . - Increase losartan  to 100 mg, maintain hydrochlorothiazide  at 12.5 mg after current supply is depleted. - Order basic metabolic panel one week after starting new losartan  dose to monitor potassium and kidney function.  # Hypokalemia Borderline low potassium likely due to hydrochlorothiazide . Losartan  may increase potassium levels. - Monitor potassium levels with basic metabolic panel one week after starting new losartan  dose.  # Gastroesophageal reflux disease (GERD) Symptoms improved with Protonix  and allergy medication. Symptoms primarily nocturnal or postprandial. - Continue Protonix  for GERD management. - Use allergy medication seasonally as needed.  Dispo: f/u as needed  Signed, Annabella Scarce, MD

## 2023-11-03 NOTE — Patient Instructions (Signed)
 Medication Instructions:  WHEN YOU FINISH YOUR CURRENT LOSARTAN  HCT TABLETS INCREASE YOUR TO 100 MG DAILY, KEEP hydrochlorothiazide  AT 12.5 MG DAILY   Labwork: BMET AFTER INCREASING LOSARTAN    Testing/Procedures: NONE  Follow-Up: AS NEEDED   Any Other Special Instructions Will Be Listed Below (If Applicable). BLOOD PRESSURE MEDICATIONS WILL NEED TO BE REFILLED BY YOUR PRIMARY CARE ONCE YOUR REFILLS ARE FINISHED

## 2023-12-13 ENCOUNTER — Other Ambulatory Visit (HOSPITAL_BASED_OUTPATIENT_CLINIC_OR_DEPARTMENT_OTHER): Payer: Self-pay | Admitting: Cardiovascular Disease

## 2024-01-04 LAB — BASIC METABOLIC PANEL WITH GFR
BUN/Creatinine Ratio: 13 (ref 12–28)
BUN: 13 mg/dL (ref 8–27)
CO2: 22 mmol/L (ref 20–29)
Calcium: 9.4 mg/dL (ref 8.7–10.3)
Chloride: 104 mmol/L (ref 96–106)
Creatinine, Ser: 0.97 mg/dL (ref 0.57–1.00)
Glucose: 89 mg/dL (ref 70–99)
Potassium: 3.9 mmol/L (ref 3.5–5.2)
Sodium: 141 mmol/L (ref 134–144)
eGFR: 67 mL/min/1.73 (ref >59)

## 2024-01-12 ENCOUNTER — Other Ambulatory Visit: Payer: Self-pay | Admitting: Internal Medicine

## 2024-01-12 DIAGNOSIS — Z1231 Encounter for screening mammogram for malignant neoplasm of breast: Secondary | ICD-10-CM

## 2024-01-17 ENCOUNTER — Ambulatory Visit: Payer: Self-pay | Admitting: Cardiovascular Disease

## 2024-01-25 ENCOUNTER — Ambulatory Visit
Admission: RE | Admit: 2024-01-25 | Discharge: 2024-01-25 | Disposition: A | Discharge status: Home / Self Care | Source: Ambulatory Visit | Attending: Internal Medicine | Admitting: Internal Medicine

## 2024-01-25 DIAGNOSIS — Z1231 Encounter for screening mammogram for malignant neoplasm of breast: Secondary | ICD-10-CM

## 2024-01-27 ENCOUNTER — Ambulatory Visit: Payer: Self-pay | Admitting: Internal Medicine

## 2024-02-01 LAB — HM MAMMOGRAPHY
